# Patient Record
Sex: Female | Born: 1940 | Race: White | Hispanic: No | Marital: Single | Smoking: Former smoker
Health system: Southern US, Community
[De-identification: ages and names within clinical notes are randomized; demographics above are authoritative.]

## PROBLEM LIST (undated history)

## (undated) DIAGNOSIS — R609 Edema, unspecified: Secondary | ICD-10-CM

## (undated) DIAGNOSIS — M545 Low back pain, unspecified: Secondary | ICD-10-CM

## (undated) DIAGNOSIS — C801 Malignant (primary) neoplasm, unspecified: Secondary | ICD-10-CM

## (undated) DIAGNOSIS — K7460 Unspecified cirrhosis of liver: Secondary | ICD-10-CM

## (undated) DIAGNOSIS — E7800 Pure hypercholesterolemia, unspecified: Secondary | ICD-10-CM

## (undated) DIAGNOSIS — K921 Melena: Secondary | ICD-10-CM

## (undated) DIAGNOSIS — M1990 Unspecified osteoarthritis, unspecified site: Secondary | ICD-10-CM

## (undated) DIAGNOSIS — H409 Unspecified glaucoma: Secondary | ICD-10-CM

## (undated) DIAGNOSIS — I10 Essential (primary) hypertension: Secondary | ICD-10-CM

## (undated) DIAGNOSIS — R0600 Dyspnea, unspecified: Secondary | ICD-10-CM

## (undated) DIAGNOSIS — IMO0002 Reserved for concepts with insufficient information to code with codable children: Secondary | ICD-10-CM

## (undated) DIAGNOSIS — Z9981 Dependence on supplemental oxygen: Secondary | ICD-10-CM

## (undated) DIAGNOSIS — R413 Other amnesia: Secondary | ICD-10-CM

## (undated) DIAGNOSIS — J189 Pneumonia, unspecified organism: Secondary | ICD-10-CM

## (undated) DIAGNOSIS — E119 Type 2 diabetes mellitus without complications: Secondary | ICD-10-CM

## (undated) DIAGNOSIS — I739 Peripheral vascular disease, unspecified: Secondary | ICD-10-CM

## (undated) DIAGNOSIS — R569 Unspecified convulsions: Secondary | ICD-10-CM

## (undated) DIAGNOSIS — G928 Other toxic encephalopathy: Secondary | ICD-10-CM

## (undated) DIAGNOSIS — J449 Chronic obstructive pulmonary disease, unspecified: Secondary | ICD-10-CM

## (undated) DIAGNOSIS — R001 Bradycardia, unspecified: Secondary | ICD-10-CM

## (undated) DIAGNOSIS — G92 Toxic encephalopathy: Secondary | ICD-10-CM

## (undated) DIAGNOSIS — R7301 Impaired fasting glucose: Secondary | ICD-10-CM

## (undated) DIAGNOSIS — K219 Gastro-esophageal reflux disease without esophagitis: Secondary | ICD-10-CM

## (undated) DIAGNOSIS — G44229 Chronic tension-type headache, not intractable: Secondary | ICD-10-CM

## (undated) DIAGNOSIS — G8929 Other chronic pain: Secondary | ICD-10-CM

## (undated) DIAGNOSIS — B070 Plantar wart: Secondary | ICD-10-CM

## (undated) DIAGNOSIS — D649 Anemia, unspecified: Secondary | ICD-10-CM

## (undated) DIAGNOSIS — K759 Inflammatory liver disease, unspecified: Secondary | ICD-10-CM

## (undated) DIAGNOSIS — G43009 Migraine without aura, not intractable, without status migrainosus: Secondary | ICD-10-CM

## (undated) DIAGNOSIS — J45909 Unspecified asthma, uncomplicated: Secondary | ICD-10-CM

## (undated) DIAGNOSIS — M6282 Rhabdomyolysis: Secondary | ICD-10-CM

## (undated) DIAGNOSIS — G250 Essential tremor: Secondary | ICD-10-CM

## (undated) DIAGNOSIS — F32A Depression, unspecified: Secondary | ICD-10-CM

## (undated) DIAGNOSIS — F410 Panic disorder [episodic paroxysmal anxiety] without agoraphobia: Secondary | ICD-10-CM

## (undated) DIAGNOSIS — F329 Major depressive disorder, single episode, unspecified: Secondary | ICD-10-CM

## (undated) DIAGNOSIS — I639 Cerebral infarction, unspecified: Secondary | ICD-10-CM

## (undated) HISTORY — PX: APPENDECTOMY: SHX54

## (undated) HISTORY — DX: Plantar wart: B07.0

## (undated) HISTORY — DX: Pure hypercholesterolemia, unspecified: E78.00

## (undated) HISTORY — DX: Toxic encephalopathy: G92

## (undated) HISTORY — DX: Depression, unspecified: F32.A

## (undated) HISTORY — DX: Essential tremor: G25.0

## (undated) HISTORY — DX: Migraine without aura, not intractable, without status migrainosus: G43.009

## (undated) HISTORY — DX: Other toxic encephalopathy: G92.8

## (undated) HISTORY — DX: Bradycardia, unspecified: R00.1

## (undated) HISTORY — DX: Anemia, unspecified: D64.9

## (undated) HISTORY — DX: Chronic tension-type headache, not intractable: G44.229

## (undated) HISTORY — DX: Panic disorder (episodic paroxysmal anxiety): F41.0

## (undated) HISTORY — DX: Edema, unspecified: R60.9

## (undated) HISTORY — PX: TONSILLECTOMY: SUR1361

## (undated) HISTORY — DX: Chronic obstructive pulmonary disease, unspecified: J44.9

## (undated) HISTORY — DX: Impaired fasting glucose: R73.01

## (undated) HISTORY — DX: Low back pain: M54.5

## (undated) HISTORY — DX: Unspecified osteoarthritis, unspecified site: M19.90

## (undated) HISTORY — DX: Other chronic pain: G89.29

## (undated) HISTORY — DX: Other amnesia: R41.3

## (undated) HISTORY — DX: Rhabdomyolysis: M62.82

## (undated) HISTORY — DX: Reserved for concepts with insufficient information to code with codable children: IMO0002

## (undated) HISTORY — DX: Unspecified asthma, uncomplicated: J45.909

## (undated) HISTORY — DX: Major depressive disorder, single episode, unspecified: F32.9

## (undated) HISTORY — DX: Gastro-esophageal reflux disease without esophagitis: K21.9

## (undated) HISTORY — DX: Cerebral infarction, unspecified: I63.9

## (undated) HISTORY — DX: Unspecified convulsions: R56.9

## (undated) HISTORY — DX: Low back pain, unspecified: M54.50

---
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
## 1969-08-20 HISTORY — PX: CHOLECYSTECTOMY: SHX55

## 1979-08-21 HISTORY — PX: OTHER SURGICAL HISTORY: SHX169

## 2004-12-20 HISTORY — PX: OTHER SURGICAL HISTORY: SHX169

## 2005-06-25 ENCOUNTER — Ambulatory Visit (HOSPITAL_COMMUNITY): Admission: RE | Admit: 2005-06-25 | Discharge: 2005-06-25 | Payer: Self-pay | Admitting: Neurology

## 2005-12-27 ENCOUNTER — Ambulatory Visit (HOSPITAL_COMMUNITY): Admission: RE | Admit: 2005-12-27 | Discharge: 2005-12-27 | Payer: Self-pay | Admitting: Neurology

## 2006-12-20 HISTORY — PX: OTHER SURGICAL HISTORY: SHX169

## 2008-12-20 HISTORY — PX: OTHER SURGICAL HISTORY: SHX169

## 2013-03-13 DIAGNOSIS — J309 Allergic rhinitis, unspecified: Secondary | ICD-10-CM | POA: Insufficient documentation

## 2013-03-13 DIAGNOSIS — M79606 Pain in leg, unspecified: Secondary | ICD-10-CM | POA: Insufficient documentation

## 2013-03-13 DIAGNOSIS — M549 Dorsalgia, unspecified: Secondary | ICD-10-CM | POA: Insufficient documentation

## 2013-03-13 DIAGNOSIS — F410 Panic disorder [episodic paroxysmal anxiety] without agoraphobia: Secondary | ICD-10-CM | POA: Insufficient documentation

## 2013-03-13 DIAGNOSIS — R5383 Other fatigue: Secondary | ICD-10-CM | POA: Insufficient documentation

## 2013-03-13 DIAGNOSIS — R569 Unspecified convulsions: Secondary | ICD-10-CM | POA: Insufficient documentation

## 2013-03-13 DIAGNOSIS — M810 Age-related osteoporosis without current pathological fracture: Secondary | ICD-10-CM | POA: Insufficient documentation

## 2013-03-13 DIAGNOSIS — F4310 Post-traumatic stress disorder, unspecified: Secondary | ICD-10-CM | POA: Insufficient documentation

## 2013-03-13 DIAGNOSIS — F32A Depression, unspecified: Secondary | ICD-10-CM | POA: Insufficient documentation

## 2013-03-13 DIAGNOSIS — F329 Major depressive disorder, single episode, unspecified: Secondary | ICD-10-CM | POA: Insufficient documentation

## 2013-03-13 DIAGNOSIS — D649 Anemia, unspecified: Secondary | ICD-10-CM | POA: Insufficient documentation

## 2013-03-13 DIAGNOSIS — I639 Cerebral infarction, unspecified: Secondary | ICD-10-CM | POA: Insufficient documentation

## 2013-03-13 DIAGNOSIS — Z79899 Other long term (current) drug therapy: Secondary | ICD-10-CM | POA: Insufficient documentation

## 2013-03-13 DIAGNOSIS — S12100A Unspecified displaced fracture of second cervical vertebra, initial encounter for closed fracture: Secondary | ICD-10-CM | POA: Insufficient documentation

## 2013-03-13 DIAGNOSIS — S32009A Unspecified fracture of unspecified lumbar vertebra, initial encounter for closed fracture: Secondary | ICD-10-CM | POA: Insufficient documentation

## 2013-04-13 DIAGNOSIS — Z9889 Other specified postprocedural states: Secondary | ICD-10-CM | POA: Insufficient documentation

## 2014-06-03 ENCOUNTER — Encounter: Payer: Self-pay | Admitting: Critical Care Medicine

## 2014-06-04 ENCOUNTER — Encounter: Payer: Self-pay | Admitting: Critical Care Medicine

## 2014-06-04 ENCOUNTER — Ambulatory Visit (INDEPENDENT_AMBULATORY_CARE_PROVIDER_SITE_OTHER): Payer: Self-pay | Admitting: Critical Care Medicine

## 2014-06-04 VITALS — BP 140/66 | HR 67 | Temp 98.4°F | Ht 60.0 in | Wt 144.0 lb

## 2014-06-04 DIAGNOSIS — G928 Other toxic encephalopathy: Secondary | ICD-10-CM | POA: Insufficient documentation

## 2014-06-04 DIAGNOSIS — G8929 Other chronic pain: Secondary | ICD-10-CM | POA: Insufficient documentation

## 2014-06-04 DIAGNOSIS — F17200 Nicotine dependence, unspecified, uncomplicated: Secondary | ICD-10-CM

## 2014-06-04 DIAGNOSIS — Z8673 Personal history of transient ischemic attack (TIA), and cerebral infarction without residual deficits: Secondary | ICD-10-CM | POA: Insufficient documentation

## 2014-06-04 DIAGNOSIS — D649 Anemia, unspecified: Secondary | ICD-10-CM | POA: Insufficient documentation

## 2014-06-04 DIAGNOSIS — M545 Low back pain: Secondary | ICD-10-CM

## 2014-06-04 DIAGNOSIS — K219 Gastro-esophageal reflux disease without esophagitis: Secondary | ICD-10-CM | POA: Insufficient documentation

## 2014-06-04 DIAGNOSIS — E7800 Pure hypercholesterolemia, unspecified: Secondary | ICD-10-CM | POA: Insufficient documentation

## 2014-06-04 DIAGNOSIS — R413 Other amnesia: Secondary | ICD-10-CM | POA: Insufficient documentation

## 2014-06-04 DIAGNOSIS — J449 Chronic obstructive pulmonary disease, unspecified: Secondary | ICD-10-CM

## 2014-06-04 DIAGNOSIS — G92 Toxic encephalopathy: Secondary | ICD-10-CM | POA: Insufficient documentation

## 2014-06-04 DIAGNOSIS — R569 Unspecified convulsions: Secondary | ICD-10-CM | POA: Insufficient documentation

## 2014-06-04 DIAGNOSIS — J4489 Other specified chronic obstructive pulmonary disease: Secondary | ICD-10-CM

## 2014-06-04 MED ORDER — IPRATROPIUM-ALBUTEROL 20-100 MCG/ACT IN AERS: INHALATION_SPRAY | RESPIRATORY_TRACT | Status: DC

## 2014-06-04 MED ORDER — BUDESONIDE-FORMOTEROL FUMARATE 160-4.5 MCG/ACT IN AERO: 2.0000 | INHALATION_SPRAY | Freq: Two times a day (BID) | RESPIRATORY_TRACT | Status: DC

## 2014-06-04 MED ORDER — PREDNISONE 10 MG PO TABS: ORAL_TABLET | ORAL | Status: DC

## 2014-06-04 MED ORDER — CEFUROXIME AXETIL 500 MG PO TABS: 500.0000 mg | ORAL_TABLET | Freq: Two times a day (BID) | ORAL | Status: DC

## 2014-06-04 NOTE — Patient Instructions (Addendum)
Take Cefuroxime 500mg  twice daily Prednisone 10mg  Take 4 for three days 3 for three days 2 for three days 1 for three days and stop Symbicort two puff twice daily Use combivent as needed Stop smoking , use nicorette minis 4mg  4-6 per day Return 6 weeks

## 2014-06-04 NOTE — Progress Notes (Addendum)
Subjective:    Patient ID: Lindsay Juarez, female    DOB: 07-22-1941, 73 y.o.   MRN: [id number] Chief Complaint  Patient presents with  . Pulmonary Consult    referred by Dr. Rochel Brome - has SOB occas at rest but mostly with exertion.  Occas cough.  Has recent sycopal episode - was admitted to [facility].      HPI Comments: Referral for Copd . Recent hospital stay.  fx foot, while in [place]. Early 90s.  Dx copd 20yrs ago. Still smoking since age 48.  Pt fell end of May and in hospital.  Started smoking again.   Shortness of Breath This is a new problem. The current episode started more than 1 month ago. The problem occurs daily (exertional only.  notes dyspnea at night). The problem has been unchanged. Associated symptoms include chest pain, sputum production and wheezing. Pertinent negatives include no abdominal pain, hemoptysis or sore throat. The symptoms are aggravated by smoke, fumes and odors. Associated symptoms comments: Mucus is light green to dark. Risk factors include smoking. She has tried beta agonist inhalers for the symptoms. The treatment provided moderate relief. Her past medical history is significant for asthma and COPD. There is no history of CAD, DVT, a heart failure, PE or pneumonia.    Past Medical History  Diagnosis Date  . Asthma   . Bradycardia   . Chronic low back pain   . Chronic tension headache   . Common migraine   . COPD (chronic obstructive pulmonary disease)   . Degenerative disc disease   . Depression   . Edema   . Familial tremor   . GERD (gastroesophageal reflux disease)   . Hypercholesteremia   . Impaired fasting glucose   . Memory loss   . Osteoarthritis   . Panic attack   . Plantar wart   . Rhabdomyolysis   . Seizures   . Toxic metabolic encephalopathy   . Anemia, unspecified   . Stroke      Family History  Problem Relation Age of Onset  . Asthma Mother   . Asthma Grandchild   . COPD Mother   . Hyperlipidemia  Mother   . Hyperlipidemia Sister   . Hypertension Father   . Hypertension Maternal Aunt   . Heart attack Mother   . Congestive Heart Failure Father   . Lung cancer Mother   . Melanoma      grandmother  . Migraines Mother   . Migraines Grandchild   . Osteoarthritis Mother   . Osteoarthritis Daughter   . Depression Mother   . Depression Daughter   . Depression Grandchild   . OCD Grandchild      History   Social History  . Marital Status: Single    Spouse Name: N/A    Number of Children: N/A  . Years of Education: N/A   Occupational History  . Not on file.   Social History Main Topics  . Smoking status: Current Every Day Smoker -- 0.50 packs/day    Types: Cigarettes    Start date: 12/21/1955  . Smokeless tobacco: Never Used  . Alcohol Use: No  . Drug Use: No  . Sexual Activity: Not on file   Other Topics Concern  . Not on file   Social History Narrative  . No narrative on file     Allergies  Allergen Reactions  . Cardizem [Diltiazem Hcl]     Muscle spasm   . Fentanyl  nausea  . Levaquin [Levofloxacin In D5w]     Rash, hives, itching  . Lithium Carbonate [Lithium]     Rash and itching  . Nadolol     bradycardia  . Penicillins     Rash, hives, itching  . Prozac [Fluoxetine Hcl]     "makes me mean."  . Vitamin K And Related     Vitamin k shots - takes skin pigmentation off arm  . Xanax [Alprazolam]     tiredness     Outpatient Prescriptions Prior to Visit  Medication Sig Dispense Refill  . albuterol (PROVENTIL HFA;VENTOLIN HFA) 108 (90 BASE) MCG/ACT inhaler Inhale 2 puffs into the lungs as needed for wheezing or shortness of breath.       . citalopram (CELEXA) 40 MG tablet Take 40 mg by mouth daily.      . clotrimazole-betamethasone (LOTRISONE) cream Apply 1 application topically 2 (two) times daily as needed.       Marland Kitchen esomeprazole (NEXIUM) 40 MG capsule Take 40 mg by mouth daily at 12 noon.      . gabapentin (NEURONTIN) 300 MG capsule Take 300  mg by mouth 3 (three) times daily.      Marland Kitchen ibuprofen (ADVIL,MOTRIN) 200 MG tablet Take 800 mg by mouth 2 (two) times daily.      Marland Kitchen morphine (MS CONTIN) 15 MG 12 hr tablet Take 15 mg by mouth 2 (two) times daily.      Marland Kitchen morphine (MSIR) 15 MG tablet Take 15 mg by mouth daily as needed for severe pain.      . potassium chloride SA (K-DUR,KLOR-CON) 20 MEQ tablet Take 20 mEq by mouth daily.      Marland Kitchen spironolactone (ALDACTONE) 25 MG tablet Take 25 mg by mouth daily.      . zafirlukast (ACCOLATE) 20 MG tablet Take 20 mg by mouth 2 (two) times daily before a meal.      . Ipratropium-Albuterol (COMBIVENT RESPIMAT) 20-100 MCG/ACT AERS respimat Inhale 2 puffs into the lungs 2 (two) times daily.      . primidone (MYSOLINE) 50 MG tablet ON HOLD      . promethazine (PHENERGAN) 25 MG tablet Take 25 mg by mouth every 6 (six) hours as needed for nausea or vomiting.       No facility-administered medications prior to visit.      Review of Systems  HENT: Positive for postnasal drip. Negative for sinus pressure, sore throat, trouble swallowing and voice change.   Respiratory: Positive for cough, sputum production, shortness of breath and wheezing. Negative for hemoptysis and choking.   Cardiovascular: Positive for chest pain.  Gastrointestinal: Negative for abdominal pain.       Objective:   Physical Exam Filed Vitals:   06/04/14 1647  BP: 140/66  Pulse: 67  Temp: 98.4 F (36.9 C)  TempSrc: Oral  Height: 5' (1.524 m)  Weight: 144 lb (65.318 kg)  SpO2: 96%    Gen: Pleasant, well-nourished, in no distress,  normal affect  ENT: No lesions,  mouth clear,  oropharynx clear, no postnasal drip  Neck: No JVD, no TMG, no carotid bruits  Lungs: No use of accessory muscles, no dullness to percussion, clear without rales or rhonchi  Cardiovascular: RRR, heart sounds normal, no murmur or gallops, no peripheral edema  Abdomen: soft and NT, no HSM,  BS normal  Musculoskeletal: No deformities, no cyanosis  or clubbing  Neuro: alert, non focal  Skin: Warm, no lesions or rashes  No results found.  CXR reviewed: copd changes Ct angio chest 05/18/14: no PE, low lung volumes, retained secretions, Ground glass inflammation both upper and lower lobes     Assessment & Plan:   COPD (chronic obstructive pulmonary disease) Copd with ongoing tobacco use. Prob stage C Recurrent exacerbations Plan Take Cefuroxime 500mg  twice daily Prednisone 10mg  Take 4 for three days 3 for three days 2 for three days 1 for three days and stop Symbicort two puff twice daily Use combivent as needed Stop smoking , use nicorette minis 4mg  4-6 per day Return 6 weeks   Tobacco use disorder >75min smoking cessation counseling given   Updated Medication List Outpatient Encounter Prescriptions as of 06/04/2014  Medication Sig  . albuterol (PROVENTIL HFA;VENTOLIN HFA) 108 (90 BASE) MCG/ACT inhaler Inhale 2 puffs into the lungs as needed for wheezing or shortness of breath.   Marland Kitchen aspirin 81 MG tablet Take 81 mg by mouth daily.  . citalopram (CELEXA) 40 MG tablet Take 40 mg by mouth daily.  . clotrimazole-betamethasone (LOTRISONE) cream Apply 1 application topically 2 (two) times daily as needed.   Marland Kitchen esomeprazole (NEXIUM) 40 MG capsule Take 40 mg by mouth daily at 12 noon.  . gabapentin (NEURONTIN) 300 MG capsule Take 300 mg by mouth 3 (three) times daily.  Marland Kitchen ibuprofen (ADVIL,MOTRIN) 200 MG tablet Take 800 mg by mouth 2 (two) times daily.  . Ipratropium-Albuterol (COMBIVENT RESPIMAT) 20-100 MCG/ACT AERS respimat 1-2 puff every 6 hours as needed  . morphine (MS CONTIN) 15 MG 12 hr tablet Take 15 mg by mouth 2 (two) times daily.  Marland Kitchen morphine (MSIR) 15 MG tablet Take 15 mg by mouth daily as needed for severe pain.  . potassium chloride SA (K-DUR,KLOR-CON) 20 MEQ tablet Take 20 mEq by mouth daily.  Marland Kitchen spironolactone (ALDACTONE) 25 MG tablet Take 25 mg by mouth daily.  . zafirlukast (ACCOLATE) 20 MG tablet Take 20 mg by mouth  2 (two) times daily before a meal.  . [DISCONTINUED] Ipratropium-Albuterol (COMBIVENT RESPIMAT) 20-100 MCG/ACT AERS respimat Inhale 2 puffs into the lungs 2 (two) times daily.  . budesonide-formoterol (SYMBICORT) 160-4.5 MCG/ACT inhaler Inhale 2 puffs into the lungs 2 (two) times daily.  . budesonide-formoterol (SYMBICORT) 160-4.5 MCG/ACT inhaler Inhale 2 puffs into the lungs 2 (two) times daily.  . cefUROXime (CEFTIN) 500 MG tablet Take 1 tablet (500 mg total) by mouth 2 (two) times daily.  . predniSONE (DELTASONE) 10 MG tablet Take 4 for two days three for two days two for two days one for two days  . primidone (MYSOLINE) 50 MG tablet ON HOLD  . [DISCONTINUED] promethazine (PHENERGAN) 25 MG tablet Take 25 mg by mouth every 6 (six) hours as needed for nausea or vomiting.

## 2014-06-05 DIAGNOSIS — F17200 Nicotine dependence, unspecified, uncomplicated: Secondary | ICD-10-CM | POA: Insufficient documentation

## 2014-06-05 NOTE — Assessment & Plan Note (Signed)
Copd with ongoing tobacco use. Prob stage C Recurrent exacerbations Plan Take Cefuroxime 500mg  twice daily Prednisone 10mg  Take 4 for three days 3 for three days 2 for three days 1 for three days and stop Symbicort two puff twice daily Use combivent as needed Stop smoking , use nicorette minis 4mg  4-6 per day Return 6 weeks

## 2014-06-05 NOTE — Assessment & Plan Note (Signed)
>  68min smoking cessation counseling given

## 2014-07-16 ENCOUNTER — Ambulatory Visit (INDEPENDENT_AMBULATORY_CARE_PROVIDER_SITE_OTHER): Payer: Self-pay | Admitting: Critical Care Medicine

## 2014-07-16 ENCOUNTER — Encounter: Payer: Self-pay | Admitting: Critical Care Medicine

## 2014-07-16 VITALS — BP 114/60 | HR 65 | Temp 99.3°F | Ht 60.0 in | Wt 161.4 lb

## 2014-07-16 DIAGNOSIS — J438 Other emphysema: Secondary | ICD-10-CM

## 2014-07-16 DIAGNOSIS — J432 Centrilobular emphysema: Secondary | ICD-10-CM

## 2014-07-16 NOTE — Patient Instructions (Signed)
No change in medications Return 3 months 

## 2014-07-16 NOTE — Progress Notes (Signed)
Subjective:    Patient ID: Lindsay Juarez, female    DOB: 1941/07/02, 73 y.o.   MRN: [id number]  HPI 07/16/2014 Chief Complaint  Patient presents with  . 6 wk follow up    DOE and cough have improved.  No chest tightness/pain.   Smoking less.  Cough and dyspnea is better.  Smokes only 2 every few days. Uses the lozenges.  On symbicort twice daily two puffs.  Is on accolate and pt feels helps the pt. Pt denies any significant sore throat, nasal congestion or excess secretions, fever, chills, sweats, unintended weight loss, pleurtic or exertional chest pain, orthopnea PND, or leg swelling Pt denies any increase in rescue therapy over baseline, denies waking up needing it or having any early am or nocturnal exacerbations of coughing/wheezing/or dyspnea. Pt also denies any obvious fluctuation in symptoms with  weather or environmental change or other alleviating or aggravating factors     Review of Systems Constitutional:   No  weight loss, night sweats,  Fevers, chills, fatigue, lassitude. HEENT:   No headaches,  Difficulty swallowing,  Tooth/dental problems,  Sore throat,                No sneezing, itching, ear ache, nasal congestion, post nasal drip,   CV:  Notes chest pain,  Orthopnea, PND, swelling in lower extremities, anasarca, dizziness, palpitations  GI  No heartburn, indigestion, abdominal pain, nausea, vomiting, diarrhea, change in bowel habits, loss of appetite  Resp: Notes shortness of breath with exertion not  at rest.  No excess mucus, no productive cough,  No non-productive cough,  No coughing up of blood.  No change in color of mucus.  No wheezing.  No chest wall deformity  Skin: no rash or lesions.  GU: no dysuria, change in color of urine, no urgency or frequency.  No flank pain.  MS:  No joint pain or swelling.  No decreased range of motion.  No back pain.  Psych:  No change in mood or affect. No depression or anxiety.  No memory loss.  Notes memory loss.     Objective:   Physical Exam  Filed Vitals:   07/16/14 0955  BP: 114/60  Pulse: 65  Temp: 99.3 F (37.4 C)  TempSrc: Oral  Height: 5' (1.524 m)  Weight: 161 lb 6.4 oz (73.211 kg)  SpO2: 97%    Gen: Pleasant, well-nourished, in no distress,  normal affect  ENT: No lesions,  mouth clear,  oropharynx clear, no postnasal drip  Neck: No JVD, no TMG, no carotid bruits  Lungs: No use of accessory muscles, no dullness to percussion, distant BS  Cardiovascular: RRR, heart sounds normal, no murmur or gallops, no peripheral edema  Abdomen: soft and NT, no HSM,  BS normal  Musculoskeletal: No deformities, no cyanosis or clubbing  Neuro: alert, non focal  Skin: Warm, no lesions or rashes  No results found.       Assessment & Plan:   COPD (chronic obstructive pulmonary disease) Gold B Copd stable at present Plan Cont inhaled meds as prescribed   Updated Medication List Outpatient Encounter Prescriptions as of 07/16/2014  Medication Sig  . albuterol (PROVENTIL HFA;VENTOLIN HFA) 108 (90 BASE) MCG/ACT inhaler Inhale 2 puffs into the lungs as needed for wheezing or shortness of breath.   Marland Kitchen aspirin 81 MG tablet Take 81 mg by mouth daily.  . budesonide-formoterol (SYMBICORT) 160-4.5 MCG/ACT inhaler Inhale 2 puffs into the lungs 2 (two) times daily.  Marland Kitchen esomeprazole ([place])  40 MG capsule Take 40 mg by mouth daily at 12 noon.  . Ipratropium-Albuterol (COMBIVENT RESPIMAT) 20-100 MCG/ACT AERS respimat 1-2 puff every 6 hours as needed  . latanoprost (XALATAN) 0.005 % ophthalmic solution Place into both eyes at bedtime.  Marland Kitchen oxyCODONE-acetaminophen (PERCOCET) 10-325 MG per tablet Take 1 tablet by mouth 3 (three) times daily.  Marland Kitchen spironolactone (ALDACTONE) 25 MG tablet Take 25 mg by mouth daily.  . zafirlukast (ACCOLATE) 20 MG tablet Take 20 mg by mouth 2 (two) times daily before a meal.  . [DISCONTINUED] budesonide-formoterol (SYMBICORT) 160-4.5 MCG/ACT inhaler Inhale 2 puffs into the  lungs 2 (two) times daily.  . [DISCONTINUED] cefUROXime (CEFTIN) 500 MG tablet Take 1 tablet (500 mg total) by mouth 2 (two) times daily.  . [DISCONTINUED] citalopram (CELEXA) 40 MG tablet Take 40 mg by mouth daily.  . [DISCONTINUED] clotrimazole-betamethasone (LOTRISONE) cream Apply 1 application topically 2 (two) times daily as needed.   . [DISCONTINUED] gabapentin (NEURONTIN) 300 MG capsule Take 300 mg by mouth 3 (three) times daily.  . [DISCONTINUED] ibuprofen (ADVIL,MOTRIN) 200 MG tablet Take 800 mg by mouth 2 (two) times daily.  . [DISCONTINUED] morphine (MS CONTIN) 15 MG 12 hr tablet Take 15 mg by mouth 2 (two) times daily.  . [DISCONTINUED] morphine (MSIR) 15 MG tablet Take 15 mg by mouth daily as needed for severe pain.  . [DISCONTINUED] potassium chloride SA (K-DUR,KLOR-CON) 20 MEQ tablet Take 20 mEq by mouth daily.  . [DISCONTINUED] predniSONE (DELTASONE) 10 MG tablet Take 4 for two days three for two days two for two days one for two days  . [DISCONTINUED] primidone (MYSOLINE) 50 MG tablet ON HOLD

## 2014-07-16 NOTE — Assessment & Plan Note (Signed)
Gold B Copd stable at present Plan Cont inhaled meds as prescribed

## 2014-11-05 ENCOUNTER — Ambulatory Visit (INDEPENDENT_AMBULATORY_CARE_PROVIDER_SITE_OTHER): Payer: Self-pay | Admitting: Critical Care Medicine

## 2014-11-05 ENCOUNTER — Encounter: Payer: Self-pay | Admitting: Critical Care Medicine

## 2014-11-05 VITALS — BP 136/60 | HR 70 | Temp 98.0°F | Ht 60.0 in | Wt 167.0 lb

## 2014-11-05 DIAGNOSIS — J441 Chronic obstructive pulmonary disease with (acute) exacerbation: Secondary | ICD-10-CM

## 2014-11-05 DIAGNOSIS — J432 Centrilobular emphysema: Secondary | ICD-10-CM

## 2014-11-05 MED ORDER — MONTELUKAST SODIUM 10 MG PO TABS: 10.0000 mg | ORAL_TABLET | Freq: Every day | ORAL | Status: AC

## 2014-11-05 NOTE — Progress Notes (Signed)
Subjective:    Patient ID: Lindsay Juarez, female    DOB: 1941/08/08, 73 y.o.   MRN: [id number]  HPI  11/05/2014 Chief Complaint  Patient presents with  . 3 month follow up    runny nose, PND, HA, and cough with yellow mucus x 1 wk.  DOE at baseline.  Not much change. Difficulty getting accolate. On singulair and may help ok.  Notes some pndrip and worse for one week.  Notes some yellow mucus , to clear and [place].  Notes dyspnea is persistent.  No smoking for two weeks.  Pt denies any significant sore throat, nasal congestion or excess secretions, fever, chills, sweats, unintended weight loss, pleurtic or exertional chest pain, orthopnea PND, or leg swelling Pt denies any increase in rescue therapy over baseline, denies waking up needing it or having any early am or nocturnal exacerbations of coughing/wheezing/or dyspnea. Pt also denies any obvious fluctuation in symptoms with  weather or environmental change or other alleviating or aggravating factors   Review of Systems Constitutional:   No  weight loss, night sweats,  Fevers, chills, fatigue, lassitude. HEENT:   No headaches,  Difficulty swallowing,  Tooth/dental problems,  Sore throat,                No sneezing, itching, ear ache, nasal congestion, post nasal drip,   CV:  Notes chest pain,  Orthopnea, PND, swelling in lower extremities, anasarca, dizziness, palpitations  GI  No heartburn, indigestion, abdominal pain, nausea, vomiting, diarrhea, change in bowel habits, loss of appetite  Resp: Notes shortness of breath with exertion not  at rest.  No excess mucus, notes productive cough,  No non-productive cough,  No coughing up of blood.  No change in color of mucus.  No wheezing.  No chest wall deformity  Skin: no rash or lesions.  GU: no dysuria, change in color of urine, no urgency or frequency.  No flank pain.  MS:  No joint pain or swelling.  No decreased range of motion.  No back pain.  Psych:  No change in mood or  affect. No depression or anxiety.  Notes memory loss.     Objective:   Physical Exam  Filed Vitals:   11/05/14 0947  BP: 136/60  Pulse: 70  Temp: 98 F (36.7 C)  TempSrc: Oral  Height: 5' (1.524 m)  Weight: 167 lb (75.751 kg)  SpO2: 95%    Gen: Pleasant, well-nourished, in no distress,  normal affect  ENT: No lesions,  mouth clear,  oropharynx clear, no postnasal drip  Neck: No JVD, no TMG, no carotid bruits  Lungs: No use of accessory muscles, no dullness to percussion, distant BS  Cardiovascular: RRR, heart sounds normal, no murmur or gallops, no peripheral edema  Abdomen: soft and NT, no HSM,  BS normal  Musculoskeletal: No deformities, no cyanosis or clubbing  Neuro: alert, non focal  Skin: Warm, no lesions or rashes  No results found.       Assessment & Plan:   COPD (chronic obstructive pulmonary disease) Copd stable at present Plan Stay off combivent Stay on symbicort and as needed albuterol Ok to be on singulair Return 4 months     Updated Medication List Outpatient Encounter Prescriptions as of 11/05/2014  Medication Sig  . albuterol (PROVENTIL HFA;VENTOLIN HFA) 108 (90 BASE) MCG/ACT inhaler Inhale 2 puffs into the lungs as needed for wheezing or shortness of breath.   Marland Kitchen aspirin 81 MG tablet Take 81 mg by mouth daily.  Marland Kitchen  atorvastatin (LIPITOR) 10 MG tablet Take 1 tablet by mouth daily.  . budesonide-formoterol (SYMBICORT) 160-4.5 MCG/ACT inhaler Inhale 2 puffs into the lungs 2 (two) times daily.  . citalopram (CELEXA) 40 MG tablet Take 1 tablet by mouth daily.  Marland Kitchen esomeprazole (NEXIUM) 40 MG capsule Take 40 mg by mouth daily at 12 noon.  . latanoprost (XALATAN) 0.005 % ophthalmic solution Place into both eyes at bedtime.  Marland Kitchen levorphanol (LEVODROMORAN) 2 MG tablet Take 1 tablet by mouth every 8 (eight) hours.  Marland Kitchen oxyCODONE-acetaminophen (PERCOCET) 10-325 MG per tablet Take 1 tablet by mouth 3 (three) times daily.  . potassium chloride SA  (K-DUR,KLOR-CON) 20 MEQ tablet Take 1 tablet by mouth daily.  Marland Kitchen spironolactone (ALDACTONE) 25 MG tablet Take 25 mg by mouth daily.  . [DISCONTINUED] zafirlukast (ACCOLATE) 20 MG tablet Take 20 mg by mouth 2 (two) times daily before a meal.  . montelukast (SINGULAIR) 10 MG tablet Take 1 tablet (10 mg total) by mouth at bedtime.  . [DISCONTINUED] Ipratropium-Albuterol (COMBIVENT RESPIMAT) 20-100 MCG/ACT AERS respimat 1-2 puff every 6 hours as needed

## 2014-11-05 NOTE — Patient Instructions (Signed)
Stay off combivent Stay on symbicort and as needed albuterol Ok to be on singulair Return 4 months

## 2014-11-05 NOTE — Assessment & Plan Note (Signed)
Copd stable at present Plan Stay off combivent Stay on symbicort and as needed albuterol Ok to be on singulair Return 4 months

## 2014-11-25 ENCOUNTER — Telehealth: Payer: Self-pay | Admitting: Critical Care Medicine

## 2014-11-25 NOTE — Telephone Encounter (Signed)
Pt returning call.Lindsay Juarez ° °

## 2014-11-25 NOTE — Telephone Encounter (Signed)
Pt returned call. Informed pt of the recs per MW. Appt made with TP on 11/26/14. Pt verbalized understanding and denied any further questions or concerns at this time.

## 2014-11-25 NOTE — Telephone Encounter (Signed)
lmtcb x1 

## 2014-11-25 NOTE — Telephone Encounter (Signed)
lmtcb for pt.  

## 2014-11-25 NOTE — Telephone Encounter (Signed)
stongly prefer ov but if declines ok to rx as she's requeting but ov with Tammy NP by 11/29/14 if not improving

## 2014-11-25 NOTE — Telephone Encounter (Signed)
Pt states she is having a hard time with her breathing right now. For the past 3 days she is having a productive cough-yellow/green in color, SOB and wheezing, vomited last night, fever as well. Pt states she has attempted to contact her PCP Dr Tobie Poet and that MD is booked up and unable to see the patient.   Pt is requesting to have same abx PW gave her on 05-25-14 visit as this works well for her.   Cefuroxime 500 mg 1 po BID. Pt is aware that PW is not in the office and will need to send MW as doc of day.   Allergies  Allergen Reactions  . Cardizem [Diltiazem Hcl]     Muscle spasm   . Fentanyl     nausea  . Levaquin [Levofloxacin In D5w]     Rash, hives, itching  . Lithium Carbonate [Lithium]     Rash and itching  . Nadolol     bradycardia  . Penicillins     Rash, hives, itching  . Prozac [Fluoxetine Hcl]     "makes me mean."  . Vitamin K And Related     Vitamin k shots - takes skin pigmentation off arm  . Xanax [Alprazolam]     tiredness

## 2014-11-26 ENCOUNTER — Ambulatory Visit (INDEPENDENT_AMBULATORY_CARE_PROVIDER_SITE_OTHER): Payer: Medicare Other | Admitting: Adult Health

## 2014-11-26 ENCOUNTER — Encounter: Payer: Self-pay | Admitting: Adult Health

## 2014-11-26 ENCOUNTER — Ambulatory Visit (INDEPENDENT_AMBULATORY_CARE_PROVIDER_SITE_OTHER)
Admission: RE | Admit: 2014-11-26 | Discharge: 2014-11-26 | Disposition: A | Discharge status: Home / Self Care | Payer: Medicare Other | Source: Ambulatory Visit | Attending: Adult Health | Admitting: Adult Health

## 2014-11-26 ENCOUNTER — Ambulatory Visit: Payer: Self-pay | Admitting: Adult Health

## 2014-11-26 VITALS — BP 138/60 | HR 77 | Temp 98.1°F | Ht 59.75 in | Wt 173.6 lb

## 2014-11-26 DIAGNOSIS — J441 Chronic obstructive pulmonary disease with (acute) exacerbation: Secondary | ICD-10-CM

## 2014-11-26 DIAGNOSIS — J432 Centrilobular emphysema: Secondary | ICD-10-CM

## 2014-11-26 MED ORDER — CEFUROXIME AXETIL 500 MG PO TABS: 500.0000 mg | ORAL_TABLET | Freq: Two times a day (BID) | ORAL | Status: DC

## 2014-11-26 NOTE — Patient Instructions (Addendum)
Ceftin 500 mg one twice daily for 7 days, take with food Mucinex DM twice daily as needed for cough, congestion Fluids and rest Tylenol As needed   Follow up Dr. Joya Gaskins  As planned and As needed   Please contact office for sooner follow up if symptoms do not improve or worsen or seek emergency care

## 2014-11-26 NOTE — Progress Notes (Signed)
   Subjective:    Patient ID: Lindsay Juarez, female    DOB: 31-Jan-1941, 73 y.o.   MRN: [id number]  HPI 73 yo female with COPD -former smoker 10/2014 .   11/26/2014 Acute OV Complains of prod cough with yellow/green mucus, increased SOB, wheezing for 3 days . Had fever up to 102 yesterday .  Patient denies any chest pain, orthopnea, PND or leg swelling Has not smoked for 1 month.    Review of Systems Constitutional:   No  weight loss, night sweats,  Fevers, chills,  +fatigue, or  lassitude.  HEENT:   No headaches,  Difficulty swallowing,  Tooth/dental problems, or  Sore throat,                No sneezing, itching, ear ache, + nasal congestion, post nasal drip,   CV:  No chest pain,  Orthopnea, PND, swelling in lower extremities, anasarca, dizziness, palpitations, syncope.   GI  No heartburn, indigestion, abdominal pain, nausea, vomiting, diarrhea, change in bowel habits, loss of appetite, bloody stools.   Resp:   No chest wall deformity  Skin: no rash or lesions.   GU: no dysuria, change in color of urine, no urgency or frequency.  No flank pain, no hematuria   MS:  No joint pain or swelling.  No decreased range of motion.  No back pain.  Psych:  No change in mood or affect. No depression or anxiety.  No memory loss.         Objective:   Physical Exam GEN: A/Ox3; pleasant , NAD  HEENT:  [place]/AT,  EACs-clear, TMs-wnl, NOSE-clear, THROAT-clear, no lesions, no postnasal drip or exudate noted.   NECK:  Supple w/ fair ROM; no JVD; normal carotid impulses w/o bruits; no thyromegaly or nodules palpated; no lymphadenopathy.  RESP  Few rhonchi , no accessory muscle use, no dullness to percussion  CARD:  RRR, no m/r/g  , no peripheral edema, pulses intact, no cyanosis or clubbing.  GI:   Soft & nt; nml bowel sounds; no organomegaly or masses detected.  Musco: Warm bil, no deformities or joint swelling noted.   Neuro: alert, no focal deficits noted.    Skin: Warm, no  lesions or rashes         Assessment & Plan:

## 2014-11-26 NOTE — Assessment & Plan Note (Signed)
Exacerbation  Check cxr   Plan  Ceftin 500 mg one twice daily for 7 days, take with food Mucinex DM twice daily as needed for cough, congestion Fluids and rest Tylenol As needed   Follow up Dr. Joya Gaskins  As planned and As needed   Please contact office for sooner follow up if symptoms do not improve or worsen or seek emergency care

## 2014-12-30 DIAGNOSIS — G894 Chronic pain syndrome: Secondary | ICD-10-CM | POA: Diagnosis not present

## 2014-12-30 DIAGNOSIS — M5416 Radiculopathy, lumbar region: Secondary | ICD-10-CM | POA: Diagnosis not present

## 2014-12-30 DIAGNOSIS — Z8781 Personal history of (healed) traumatic fracture: Secondary | ICD-10-CM | POA: Diagnosis not present

## 2014-12-30 DIAGNOSIS — M5136 Other intervertebral disc degeneration, lumbar region: Secondary | ICD-10-CM | POA: Diagnosis not present

## 2015-01-20 DIAGNOSIS — F1120 Opioid dependence, uncomplicated: Secondary | ICD-10-CM | POA: Diagnosis not present

## 2015-01-20 DIAGNOSIS — M5136 Other intervertebral disc degeneration, lumbar region: Secondary | ICD-10-CM | POA: Diagnosis not present

## 2015-01-20 DIAGNOSIS — M5416 Radiculopathy, lumbar region: Secondary | ICD-10-CM | POA: Diagnosis not present

## 2015-01-20 DIAGNOSIS — Z8781 Personal history of (healed) traumatic fracture: Secondary | ICD-10-CM | POA: Diagnosis not present

## 2015-01-20 DIAGNOSIS — G894 Chronic pain syndrome: Secondary | ICD-10-CM | POA: Diagnosis not present

## 2015-01-22 DIAGNOSIS — M5126 Other intervertebral disc displacement, lumbar region: Secondary | ICD-10-CM | POA: Diagnosis not present

## 2015-01-22 DIAGNOSIS — M47816 Spondylosis without myelopathy or radiculopathy, lumbar region: Secondary | ICD-10-CM | POA: Diagnosis not present

## 2015-01-22 DIAGNOSIS — M5136 Other intervertebral disc degeneration, lumbar region: Secondary | ICD-10-CM | POA: Diagnosis not present

## 2015-01-22 DIAGNOSIS — M4806 Spinal stenosis, lumbar region: Secondary | ICD-10-CM | POA: Diagnosis not present

## 2015-02-17 DIAGNOSIS — M5416 Radiculopathy, lumbar region: Secondary | ICD-10-CM | POA: Diagnosis not present

## 2015-02-17 DIAGNOSIS — G894 Chronic pain syndrome: Secondary | ICD-10-CM | POA: Diagnosis not present

## 2015-02-17 DIAGNOSIS — M5136 Other intervertebral disc degeneration, lumbar region: Secondary | ICD-10-CM | POA: Diagnosis not present

## 2015-02-17 DIAGNOSIS — M5090 Cervical disc disorder, unspecified, unspecified cervical region: Secondary | ICD-10-CM | POA: Diagnosis not present

## 2015-02-17 DIAGNOSIS — F1120 Opioid dependence, uncomplicated: Secondary | ICD-10-CM | POA: Diagnosis not present

## 2015-03-04 DIAGNOSIS — E780 Pure hypercholesterolemia: Secondary | ICD-10-CM | POA: Diagnosis not present

## 2015-03-04 DIAGNOSIS — M5416 Radiculopathy, lumbar region: Secondary | ICD-10-CM | POA: Diagnosis not present

## 2015-03-04 DIAGNOSIS — R7301 Impaired fasting glucose: Secondary | ICD-10-CM | POA: Diagnosis not present

## 2015-03-26 DIAGNOSIS — M549 Dorsalgia, unspecified: Secondary | ICD-10-CM | POA: Diagnosis not present

## 2015-03-26 DIAGNOSIS — G894 Chronic pain syndrome: Secondary | ICD-10-CM | POA: Diagnosis not present

## 2015-03-26 DIAGNOSIS — M4716 Other spondylosis with myelopathy, lumbar region: Secondary | ICD-10-CM | POA: Diagnosis not present

## 2015-03-26 DIAGNOSIS — M4806 Spinal stenosis, lumbar region: Secondary | ICD-10-CM | POA: Diagnosis not present

## 2015-03-26 DIAGNOSIS — M419 Scoliosis, unspecified: Secondary | ICD-10-CM | POA: Diagnosis not present

## 2015-03-31 DIAGNOSIS — F1120 Opioid dependence, uncomplicated: Secondary | ICD-10-CM | POA: Diagnosis not present

## 2015-03-31 DIAGNOSIS — M5416 Radiculopathy, lumbar region: Secondary | ICD-10-CM | POA: Diagnosis not present

## 2015-03-31 DIAGNOSIS — M5136 Other intervertebral disc degeneration, lumbar region: Secondary | ICD-10-CM | POA: Diagnosis not present

## 2015-03-31 DIAGNOSIS — M5090 Cervical disc disorder, unspecified, unspecified cervical region: Secondary | ICD-10-CM | POA: Diagnosis not present

## 2015-03-31 DIAGNOSIS — G894 Chronic pain syndrome: Secondary | ICD-10-CM | POA: Diagnosis not present

## 2015-04-07 DIAGNOSIS — I10 Essential (primary) hypertension: Secondary | ICD-10-CM | POA: Diagnosis not present

## 2015-04-07 DIAGNOSIS — F321 Major depressive disorder, single episode, moderate: Secondary | ICD-10-CM | POA: Diagnosis not present

## 2015-04-10 DIAGNOSIS — R509 Fever, unspecified: Secondary | ICD-10-CM | POA: Diagnosis not present

## 2015-04-10 DIAGNOSIS — R0602 Shortness of breath: Secondary | ICD-10-CM | POA: Diagnosis not present

## 2015-04-10 DIAGNOSIS — R05 Cough: Secondary | ICD-10-CM | POA: Diagnosis not present

## 2015-04-10 DIAGNOSIS — J440 Chronic obstructive pulmonary disease with acute lower respiratory infection: Secondary | ICD-10-CM | POA: Diagnosis not present

## 2015-04-10 DIAGNOSIS — J018 Other acute sinusitis: Secondary | ICD-10-CM | POA: Diagnosis not present

## 2015-04-10 DIAGNOSIS — J209 Acute bronchitis, unspecified: Secondary | ICD-10-CM | POA: Diagnosis not present

## 2015-04-17 DIAGNOSIS — J441 Chronic obstructive pulmonary disease with (acute) exacerbation: Secondary | ICD-10-CM | POA: Diagnosis not present

## 2015-04-17 DIAGNOSIS — M5416 Radiculopathy, lumbar region: Secondary | ICD-10-CM | POA: Diagnosis not present

## 2015-04-30 DIAGNOSIS — M4696 Unspecified inflammatory spondylopathy, lumbar region: Secondary | ICD-10-CM | POA: Diagnosis not present

## 2015-04-30 DIAGNOSIS — M5417 Radiculopathy, lumbosacral region: Secondary | ICD-10-CM | POA: Diagnosis not present

## 2015-04-30 DIAGNOSIS — Z79899 Other long term (current) drug therapy: Secondary | ICD-10-CM | POA: Diagnosis not present

## 2015-04-30 DIAGNOSIS — Z5181 Encounter for therapeutic drug level monitoring: Secondary | ICD-10-CM | POA: Diagnosis not present

## 2015-05-02 DIAGNOSIS — J441 Chronic obstructive pulmonary disease with (acute) exacerbation: Secondary | ICD-10-CM | POA: Diagnosis not present

## 2015-05-27 ENCOUNTER — Ambulatory Visit (INDEPENDENT_AMBULATORY_CARE_PROVIDER_SITE_OTHER): Payer: Self-pay | Admitting: Critical Care Medicine

## 2015-05-27 ENCOUNTER — Encounter: Payer: Self-pay | Admitting: Critical Care Medicine

## 2015-05-27 VITALS — BP 108/60 | HR 68 | Temp 98.5°F | Ht 59.0 in | Wt 165.6 lb

## 2015-05-27 DIAGNOSIS — J449 Chronic obstructive pulmonary disease, unspecified: Secondary | ICD-10-CM

## 2015-05-27 DIAGNOSIS — J441 Chronic obstructive pulmonary disease with (acute) exacerbation: Secondary | ICD-10-CM

## 2015-05-27 DIAGNOSIS — J209 Acute bronchitis, unspecified: Secondary | ICD-10-CM | POA: Diagnosis not present

## 2015-05-27 MED ORDER — LOSARTAN POTASSIUM 50 MG PO TABS: 50.0000 mg | ORAL_TABLET | Freq: Every day | ORAL | Status: DC

## 2015-05-27 MED ORDER — PREDNISONE 10 MG PO TABS: ORAL_TABLET | ORAL | Status: DC

## 2015-05-27 MED ORDER — DEXTROMETHORPHAN POLISTIREX ER 30 MG/5ML PO SUER: 60.0000 mg | ORAL | Status: DC | PRN

## 2015-05-27 MED ORDER — CEFUROXIME AXETIL 500 MG PO TABS: 500.0000 mg | ORAL_TABLET | Freq: Two times a day (BID) | ORAL | Status: DC

## 2015-05-27 NOTE — Patient Instructions (Addendum)
Stop lisinopril, causes cough Start losartan '50mg'$  daily to replace lisinopril, will not cause cough Prednisone '10mg'$  Take 4 for three days 3 for three days 2 for three days 1 for three days and stop Cefuroxime '500mg'$  twice daily for 7days Stay on symbicort USe delsym as needed for cough 10ML 3-4 times daily, over the counter Return 6 weeks

## 2015-05-27 NOTE — Assessment & Plan Note (Signed)
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

## 2015-05-27 NOTE — Progress Notes (Signed)
Subjective:    Patient ID: Lindsay Juarez, female    DOB: [birth year], 74 y.o.   MRN: [id number]  HPI 05/27/2015 Chief Complaint  Patient presents with  . Follow-up    c/o cough until can't breathe x 2 mths.,keeping up at night,very rare brings up clear,occass. wheezing,sob worse,midchest tightness, soreness across chest from coughing,no fcs,?pnd. Face turns red with coughing,neb. does not help.  Recurrent cough Hx of copd on symbicort , asthma component. Last seen 12/15 for AB flare. More difficulty since 02/2015 and assoc cough and cough to point cant breath.  Cough is minimal, occ clear.  No blood. Notes some chest pain and tightness.  Pt on neb meds and inhalers. PCP rx some prednisone :  > 1 month ago, ABX > 1 month ago.  CXR: done  Pt denies any significant sore throat, nasal congestion or excess secretions, fever, chills, sweats, unintended weight loss, pleurtic or exertional chest pain, orthopnea PND, or leg swelling Pt denies any increase in rescue therapy over baseline, denies waking up needing it or having any early am or nocturnal exacerbations of coughing/wheezing/or dyspnea. Pt also denies any obvious fluctuation in symptoms with  weather or environmental change or other alleviating or aggravating factors   Current Medications, Allergies, Complete Past Medical History, Past Surgical History, Family History, and Social History were reviewed in [facility] record per todays encounter:  05/27/2015   Review of Systems  Constitutional: Negative.   HENT: Positive for postnasal drip, rhinorrhea and sinus pressure. Negative for ear pain, sore throat, trouble swallowing and voice change.   Eyes: Negative.   Respiratory: Positive for cough, chest tightness, shortness of breath and wheezing. Negative for apnea, choking and stridor.   Cardiovascular: Negative.  Negative for chest pain, palpitations and leg swelling.  Gastrointestinal: Negative for nausea, vomiting,  abdominal pain and abdominal distention.       Belching   Genitourinary: Negative.   Musculoskeletal: Negative.  Negative for myalgias and arthralgias.  Skin: Negative.  Negative for rash.  Allergic/Immunologic: Negative.  Negative for environmental allergies and food allergies.  Neurological: Negative.  Negative for dizziness, syncope, weakness and headaches.  Hematological: Negative.  Negative for adenopathy. Does not bruise/bleed easily.  Psychiatric/Behavioral: Negative.  Negative for sleep disturbance and agitation. The patient is not nervous/anxious.        Objective:   Physical Exam Filed Vitals:   05/27/15 1144  BP: 108/60  Pulse: 68  Temp: 98.5 F (36.9 C)  TempSrc: Oral  Height: '4\' 11"'$  (1.499 m)  Weight: 165 lb 9.6 oz (75.116 kg)  SpO2: 94%    Gen: Pleasant, well-nourished, in no distress,  normal affect  ENT: No lesions,  mouth clear,  oropharynx clear, no postnasal drip  Neck: No JVD, no TMG, no carotid bruits  Lungs: No use of accessory muscles, no dullness to percussion,  Inspiratory expiratory wheezes poor airflow  Cardiovascular: RRR, heart sounds normal, no murmur or gallops, no peripheral edema  Abdomen: soft and NT, no HSM,  BS normal  Musculoskeletal: No deformities, no cyanosis or clubbing  Neuro: alert, non focal  Skin: Warm, no lesions or rashes  No results found.        Assessment & Plan:  I personally reviewed all images and lab data in the [facility] system as well as any outside material available during this office visit and agree with the  radiology impressions.   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  Diagnoses and all orders for this visit:  COPD with chronic bronchitis  Acute bronchitis, unspecified organism  Chronic obstructive pulmonary disease with acute exacerbation  Other orders -     losartan (COZAAR) 50 MG tablet; Take 1 tablet (50 mg total) by mouth daily. -     predniSONE (DELTASONE) 10 MG tablet; Take 4 for three days 3 for three days 2 for three days 1 for three days and stop -     cefUROXime (CEFTIN) 500 MG tablet; Take 1 tablet (500 mg total) by mouth 2 (two) times daily. -     dextromethorphan (DELSYM) 30 MG/5ML liquid; Take 10 mLs (60 mg total) by mouth as needed for cough.

## 2015-06-06 DIAGNOSIS — M4696 Unspecified inflammatory spondylopathy, lumbar region: Secondary | ICD-10-CM | POA: Diagnosis not present

## 2015-06-06 DIAGNOSIS — M4806 Spinal stenosis, lumbar region: Secondary | ICD-10-CM | POA: Diagnosis not present

## 2015-06-06 DIAGNOSIS — M5417 Radiculopathy, lumbosacral region: Secondary | ICD-10-CM | POA: Diagnosis not present

## 2015-06-06 DIAGNOSIS — G894 Chronic pain syndrome: Secondary | ICD-10-CM | POA: Diagnosis not present

## 2015-06-12 DIAGNOSIS — E780 Pure hypercholesterolemia: Secondary | ICD-10-CM | POA: Diagnosis not present

## 2015-06-12 DIAGNOSIS — E119 Type 2 diabetes mellitus without complications: Secondary | ICD-10-CM | POA: Diagnosis not present

## 2015-06-12 DIAGNOSIS — I10 Essential (primary) hypertension: Secondary | ICD-10-CM | POA: Diagnosis not present

## 2015-06-12 DIAGNOSIS — J441 Chronic obstructive pulmonary disease with (acute) exacerbation: Secondary | ICD-10-CM | POA: Diagnosis not present

## 2015-06-12 DIAGNOSIS — D649 Anemia, unspecified: Secondary | ICD-10-CM | POA: Diagnosis not present

## 2015-06-12 DIAGNOSIS — M5416 Radiculopathy, lumbar region: Secondary | ICD-10-CM | POA: Diagnosis not present

## 2015-06-27 DIAGNOSIS — J449 Chronic obstructive pulmonary disease, unspecified: Secondary | ICD-10-CM | POA: Diagnosis not present

## 2015-06-27 DIAGNOSIS — Z885 Allergy status to narcotic agent status: Secondary | ICD-10-CM | POA: Diagnosis not present

## 2015-06-27 DIAGNOSIS — Z888 Allergy status to other drugs, medicaments and biological substances status: Secondary | ICD-10-CM | POA: Diagnosis not present

## 2015-06-27 DIAGNOSIS — K219 Gastro-esophageal reflux disease without esophagitis: Secondary | ICD-10-CM | POA: Diagnosis not present

## 2015-06-27 DIAGNOSIS — M47897 Other spondylosis, lumbosacral region: Secondary | ICD-10-CM | POA: Diagnosis not present

## 2015-06-27 DIAGNOSIS — E785 Hyperlipidemia, unspecified: Secondary | ICD-10-CM | POA: Diagnosis not present

## 2015-06-27 DIAGNOSIS — M4696 Unspecified inflammatory spondylopathy, lumbar region: Secondary | ICD-10-CM | POA: Diagnosis not present

## 2015-06-27 DIAGNOSIS — Z880 Allergy status to penicillin: Secondary | ICD-10-CM | POA: Diagnosis not present

## 2015-06-27 DIAGNOSIS — Z90710 Acquired absence of both cervix and uterus: Secondary | ICD-10-CM | POA: Diagnosis not present

## 2015-06-27 DIAGNOSIS — Z9049 Acquired absence of other specified parts of digestive tract: Secondary | ICD-10-CM | POA: Diagnosis not present

## 2015-06-27 DIAGNOSIS — F17210 Nicotine dependence, cigarettes, uncomplicated: Secondary | ICD-10-CM | POA: Diagnosis not present

## 2015-06-27 DIAGNOSIS — F329 Major depressive disorder, single episode, unspecified: Secondary | ICD-10-CM | POA: Diagnosis not present

## 2015-06-27 DIAGNOSIS — J45909 Unspecified asthma, uncomplicated: Secondary | ICD-10-CM | POA: Diagnosis not present

## 2015-06-27 DIAGNOSIS — M1991 Primary osteoarthritis, unspecified site: Secondary | ICD-10-CM | POA: Diagnosis not present

## 2015-06-27 DIAGNOSIS — F419 Anxiety disorder, unspecified: Secondary | ICD-10-CM | POA: Diagnosis not present

## 2015-06-27 DIAGNOSIS — M545 Low back pain: Secondary | ICD-10-CM | POA: Diagnosis not present

## 2015-07-01 ENCOUNTER — Ambulatory Visit: Payer: Self-pay | Admitting: Critical Care Medicine

## 2015-07-02 DIAGNOSIS — M79661 Pain in right lower leg: Secondary | ICD-10-CM | POA: Diagnosis not present

## 2015-07-02 DIAGNOSIS — W19XXXA Unspecified fall, initial encounter: Secondary | ICD-10-CM | POA: Diagnosis not present

## 2015-07-02 DIAGNOSIS — M1711 Unilateral primary osteoarthritis, right knee: Secondary | ICD-10-CM | POA: Diagnosis not present

## 2015-07-02 DIAGNOSIS — M7989 Other specified soft tissue disorders: Secondary | ICD-10-CM | POA: Diagnosis not present

## 2015-07-02 DIAGNOSIS — M25571 Pain in right ankle and joints of right foot: Secondary | ICD-10-CM | POA: Diagnosis not present

## 2015-07-02 DIAGNOSIS — S99921A Unspecified injury of right foot, initial encounter: Secondary | ICD-10-CM | POA: Diagnosis not present

## 2015-07-02 DIAGNOSIS — S8991XA Unspecified injury of right lower leg, initial encounter: Secondary | ICD-10-CM | POA: Diagnosis not present

## 2015-07-02 DIAGNOSIS — M25561 Pain in right knee: Secondary | ICD-10-CM | POA: Diagnosis not present

## 2015-07-02 DIAGNOSIS — M179 Osteoarthritis of knee, unspecified: Secondary | ICD-10-CM | POA: Diagnosis not present

## 2015-07-02 DIAGNOSIS — M79671 Pain in right foot: Secondary | ICD-10-CM | POA: Diagnosis not present

## 2015-07-02 DIAGNOSIS — M19071 Primary osteoarthritis, right ankle and foot: Secondary | ICD-10-CM | POA: Diagnosis not present

## 2015-07-02 DIAGNOSIS — S99911A Unspecified injury of right ankle, initial encounter: Secondary | ICD-10-CM | POA: Diagnosis not present

## 2015-07-02 DIAGNOSIS — M79604 Pain in right leg: Secondary | ICD-10-CM | POA: Diagnosis not present

## 2015-07-14 DIAGNOSIS — G301 Alzheimer's disease with late onset: Secondary | ICD-10-CM | POA: Diagnosis not present

## 2015-07-14 DIAGNOSIS — M25571 Pain in right ankle and joints of right foot: Secondary | ICD-10-CM | POA: Diagnosis not present

## 2015-07-14 DIAGNOSIS — J449 Chronic obstructive pulmonary disease, unspecified: Secondary | ICD-10-CM | POA: Diagnosis not present

## 2015-07-14 DIAGNOSIS — E119 Type 2 diabetes mellitus without complications: Secondary | ICD-10-CM | POA: Diagnosis not present

## 2015-07-14 DIAGNOSIS — M1711 Unilateral primary osteoarthritis, right knee: Secondary | ICD-10-CM | POA: Diagnosis not present

## 2015-07-22 ENCOUNTER — Ambulatory Visit: Payer: Self-pay | Admitting: Critical Care Medicine

## 2015-07-24 DIAGNOSIS — Z791 Long term (current) use of non-steroidal anti-inflammatories (NSAID): Secondary | ICD-10-CM | POA: Diagnosis not present

## 2015-07-24 DIAGNOSIS — Z885 Allergy status to narcotic agent status: Secondary | ICD-10-CM | POA: Diagnosis not present

## 2015-07-24 DIAGNOSIS — F329 Major depressive disorder, single episode, unspecified: Secondary | ICD-10-CM | POA: Diagnosis not present

## 2015-07-24 DIAGNOSIS — M47897 Other spondylosis, lumbosacral region: Secondary | ICD-10-CM | POA: Diagnosis not present

## 2015-07-24 DIAGNOSIS — J45909 Unspecified asthma, uncomplicated: Secondary | ICD-10-CM | POA: Diagnosis not present

## 2015-07-24 DIAGNOSIS — Z79899 Other long term (current) drug therapy: Secondary | ICD-10-CM | POA: Diagnosis not present

## 2015-07-24 DIAGNOSIS — M47817 Spondylosis without myelopathy or radiculopathy, lumbosacral region: Secondary | ICD-10-CM | POA: Diagnosis not present

## 2015-07-24 DIAGNOSIS — Z79891 Long term (current) use of opiate analgesic: Secondary | ICD-10-CM | POA: Diagnosis not present

## 2015-07-24 DIAGNOSIS — Z7951 Long term (current) use of inhaled steroids: Secondary | ICD-10-CM | POA: Diagnosis not present

## 2015-07-24 DIAGNOSIS — Z888 Allergy status to other drugs, medicaments and biological substances status: Secondary | ICD-10-CM | POA: Diagnosis not present

## 2015-07-24 DIAGNOSIS — Z9109 Other allergy status, other than to drugs and biological substances: Secondary | ICD-10-CM | POA: Diagnosis not present

## 2015-07-24 DIAGNOSIS — F419 Anxiety disorder, unspecified: Secondary | ICD-10-CM | POA: Diagnosis not present

## 2015-07-24 DIAGNOSIS — J449 Chronic obstructive pulmonary disease, unspecified: Secondary | ICD-10-CM | POA: Diagnosis not present

## 2015-07-24 DIAGNOSIS — Z880 Allergy status to penicillin: Secondary | ICD-10-CM | POA: Diagnosis not present

## 2015-07-24 DIAGNOSIS — F17200 Nicotine dependence, unspecified, uncomplicated: Secondary | ICD-10-CM | POA: Diagnosis not present

## 2015-07-30 DIAGNOSIS — G894 Chronic pain syndrome: Secondary | ICD-10-CM | POA: Diagnosis not present

## 2015-07-30 DIAGNOSIS — M4696 Unspecified inflammatory spondylopathy, lumbar region: Secondary | ICD-10-CM | POA: Diagnosis not present

## 2015-07-30 DIAGNOSIS — M4806 Spinal stenosis, lumbar region: Secondary | ICD-10-CM | POA: Diagnosis not present

## 2015-07-30 DIAGNOSIS — M5417 Radiculopathy, lumbosacral region: Secondary | ICD-10-CM | POA: Diagnosis not present

## 2015-08-05 ENCOUNTER — Encounter: Payer: Self-pay | Admitting: Critical Care Medicine

## 2015-08-05 ENCOUNTER — Ambulatory Visit (INDEPENDENT_AMBULATORY_CARE_PROVIDER_SITE_OTHER): Payer: Self-pay | Admitting: Critical Care Medicine

## 2015-08-05 VITALS — BP 154/60 | HR 101 | Temp 99.3°F | Ht 59.0 in | Wt 167.2 lb

## 2015-08-05 DIAGNOSIS — J209 Acute bronchitis, unspecified: Secondary | ICD-10-CM | POA: Diagnosis not present

## 2015-08-05 DIAGNOSIS — J449 Chronic obstructive pulmonary disease, unspecified: Secondary | ICD-10-CM | POA: Diagnosis not present

## 2015-08-05 DIAGNOSIS — J441 Chronic obstructive pulmonary disease with (acute) exacerbation: Secondary | ICD-10-CM

## 2015-08-05 DIAGNOSIS — B1920 Unspecified viral hepatitis C without hepatic coma: Secondary | ICD-10-CM | POA: Insufficient documentation

## 2015-08-05 DIAGNOSIS — J411 Mucopurulent chronic bronchitis: Secondary | ICD-10-CM

## 2015-08-05 MED ORDER — PREDNISONE 10 MG PO TABS: ORAL_TABLET | ORAL | Status: DC

## 2015-08-05 MED ORDER — CEFUROXIME AXETIL 500 MG PO TABS: 500.0000 mg | ORAL_TABLET | Freq: Two times a day (BID) | ORAL | Status: DC

## 2015-08-05 MED ORDER — METHYLPREDNISOLONE ACETATE 80 MG/ML IJ SUSP
120.0000 mg | Freq: Once | INTRAMUSCULAR | Status: AC
  Administered 2015-08-05: 120 mg via INTRAMUSCULAR

## 2015-08-05 NOTE — Progress Notes (Signed)
Subjective:    Patient ID: Lindsay Juarez, female    DOB: 1941-09-03, 74 y.o.   MRN: [id number]  HPI 08/05/2015 Chief Complaint  Patient presents with  . 6 wk follow up    Symptoms improved with abx and prednisone.  C/o head congestion, runny nose, increased nonprod cough, chest tightness, and increased SOB at times x  1 wk.  No f/c/s.   Symptoms are better on pred/abx.  Nose is still stuffy.  Cough is dry.  Hacky type cough.  No f/c/s. Pt notes , +++nasal congestion but no  excess secretions,NO  fever, chills, sweats, unintended weight loss, pleurtic or exertional chest pain, orthopnea PND, or leg swelling Pt denies any increase in rescue therapy over baseline, denies waking up needing it or having any early am or nocturnal exacerbations of/wheezing/or dyspnea. Pt does wake up coughing and gets a headache  Pt also denies any obvious fluctuation in symptoms with  weather or environmental change or other alleviating or aggravating factors   Current Medications, Allergies, Complete Past Medical History, Past Surgical History, Family History, and Social History were reviewed in [place] record per todays encounter:  08/05/2015   Review of Systems  Constitutional: Negative.   HENT: Negative.  Negative for ear pain, postnasal drip, rhinorrhea, sinus pressure, sore throat, trouble swallowing and voice change.   Eyes: Negative.   Respiratory: Positive for cough, shortness of breath and wheezing. Negative for apnea, choking, chest tightness and stridor.   Cardiovascular: Negative.  Negative for chest pain, palpitations and leg swelling.  Gastrointestinal: Negative.  Negative for nausea, vomiting, abdominal pain and abdominal distention.  Genitourinary: Negative.   Musculoskeletal: Negative.  Negative for myalgias and arthralgias.  Skin: Negative.  Negative for rash.  Allergic/Immunologic: Negative.  Negative for environmental allergies and food allergies.    Neurological: Negative.  Negative for dizziness, syncope, weakness and headaches.  Hematological: Negative.  Negative for adenopathy. Does not bruise/bleed easily.  Psychiatric/Behavioral: Negative.  Negative for sleep disturbance and agitation. The patient is not nervous/anxious.        Objective:   Physical Exam Filed Vitals:   08/05/15 1621  BP: 154/60  Pulse: 101  Temp: 99.3 F (37.4 C)  TempSrc: Oral  Height: '4\' 11"'$  (1.499 m)  Weight: 167 lb 3.2 oz (75.841 kg)  SpO2: 96%    Gen: Pleasant, well-nourished, in no distress,  normal affect  ENT: No lesions,  mouth clear,  oropharynx clear, no postnasal drip  Neck: No JVD, no TMG, no carotid bruits  Lungs: No use of accessory muscles, no dullness to percussion, exp wheezes, poor airflow  Cardiovascular: RRR, heart sounds normal, no murmur or gallops, no peripheral edema  Abdomen: soft and NT, no HSM,  BS normal  Musculoskeletal: No deformities, no cyanosis or clubbing  Neuro: alert, non focal  Skin: Warm, no lesions or rashes  No results found.        Assessment & Plan:  I personally reviewed all images and lab data in the [facility] system as well as any outside material available during this office visit and agree with the  radiology impressions.   COPD (chronic obstructive pulmonary disease) Copd with acute exacerbation Plan Cefuroxime '500mg'$  twice daily for 7days Prednisone '10mg'$  Take 4 for four days 3 for four days 2 for four days 1 for four days Stay on symbicort  A Depomedrol '120mg'$  injection was given Return 2 months   Lindsay Juarez was seen today for 6 wk follow up.  Diagnoses and all orders for this visit:  Acute bronchitis, unspecified organism -     methylPREDNISolone acetate (DEPO-MEDROL) injection 120 mg; Inject 1.5 mLs (120 mg total) into the muscle once.  COPD with chronic bronchitis  COPD with acute exacerbation -     methylPREDNISolone acetate (DEPO-MEDROL) injection 120 mg; Inject 1.5 mLs (120  mg total) into the muscle once.  Mucopurulent chronic bronchitis  Other orders -     cefUROXime (CEFTIN) 500 MG tablet; Take 1 tablet (500 mg total) by mouth 2 (two) times daily. -     predniSONE (DELTASONE) 10 MG tablet; Take 4 for four days 3 for four days 2 for four days 1 for four days

## 2015-08-05 NOTE — Patient Instructions (Signed)
Cefuroxime '500mg'$  twice daily for 7days Prednisone '10mg'$  Take 4 for four days 3 for four days 2 for four days 1 for four days Stay on symbicort  A Depomedrol '120mg'$  injection was given Return 2 months

## 2015-08-06 NOTE — Assessment & Plan Note (Signed)
Copd with acute exacerbation Plan Cefuroxime '500mg'$  twice daily for 7days Prednisone '10mg'$  Take 4 for four days 3 for four days 2 for four days 1 for four days Stay on symbicort  A Depomedrol '120mg'$  injection was given Return 2 months

## 2015-08-20 ENCOUNTER — Other Ambulatory Visit: Payer: Self-pay | Admitting: Critical Care Medicine

## 2015-08-22 NOTE — Telephone Encounter (Signed)
Received refill request for ceftin 500 mg take 1 po bid #[id number] from [facility].  This is not listed as a maintanence med on pt's med list.  lmomtcb for pt to discuss - is she having acute symptoms?

## 2015-08-27 ENCOUNTER — Telehealth: Payer: Self-pay | Admitting: Critical Care Medicine

## 2015-08-27 NOTE — Telephone Encounter (Signed)
Lindsay Juarez aware, nothing further needed at this time.

## 2015-08-27 NOTE — Telephone Encounter (Signed)
lmomtcb for pt on #s listed as home and cell #s.  Will sign off and deny rx stating pt must call office for rx.

## 2015-08-27 NOTE — Telephone Encounter (Signed)
I do not see that anyone called  She is due for ov with PW in Oct- maybe a scheduler called?? LMTCB for Tammy

## 2015-09-01 DIAGNOSIS — J0101 Acute recurrent maxillary sinusitis: Secondary | ICD-10-CM | POA: Diagnosis not present

## 2015-09-01 DIAGNOSIS — J441 Chronic obstructive pulmonary disease with (acute) exacerbation: Secondary | ICD-10-CM | POA: Diagnosis not present

## 2015-09-01 DIAGNOSIS — J449 Chronic obstructive pulmonary disease, unspecified: Secondary | ICD-10-CM | POA: Diagnosis not present

## 2015-09-15 DIAGNOSIS — Z23 Encounter for immunization: Secondary | ICD-10-CM | POA: Diagnosis not present

## 2015-09-15 DIAGNOSIS — E119 Type 2 diabetes mellitus without complications: Secondary | ICD-10-CM | POA: Diagnosis not present

## 2015-09-15 DIAGNOSIS — R05 Cough: Secondary | ICD-10-CM | POA: Diagnosis not present

## 2015-09-15 DIAGNOSIS — I10 Essential (primary) hypertension: Secondary | ICD-10-CM | POA: Diagnosis not present

## 2015-09-15 DIAGNOSIS — E780 Pure hypercholesterolemia: Secondary | ICD-10-CM | POA: Diagnosis not present

## 2015-09-15 DIAGNOSIS — E538 Deficiency of other specified B group vitamins: Secondary | ICD-10-CM | POA: Diagnosis not present

## 2015-09-15 DIAGNOSIS — E1100 Type 2 diabetes mellitus with hyperosmolarity without nonketotic hyperglycemic-hyperosmolar coma (NKHHC): Secondary | ICD-10-CM | POA: Diagnosis not present

## 2015-09-15 DIAGNOSIS — S098XXA Other specified injuries of head, initial encounter: Secondary | ICD-10-CM | POA: Diagnosis not present

## 2015-09-15 DIAGNOSIS — G301 Alzheimer's disease with late onset: Secondary | ICD-10-CM | POA: Diagnosis not present

## 2015-09-15 DIAGNOSIS — R600 Localized edema: Secondary | ICD-10-CM | POA: Diagnosis not present

## 2015-09-26 DIAGNOSIS — M1990 Unspecified osteoarthritis, unspecified site: Secondary | ICD-10-CM | POA: Diagnosis not present

## 2015-09-26 DIAGNOSIS — Z881 Allergy status to other antibiotic agents status: Secondary | ICD-10-CM | POA: Diagnosis not present

## 2015-09-26 DIAGNOSIS — Z885 Allergy status to narcotic agent status: Secondary | ICD-10-CM | POA: Diagnosis not present

## 2015-09-26 DIAGNOSIS — I10 Essential (primary) hypertension: Secondary | ICD-10-CM | POA: Diagnosis not present

## 2015-09-26 DIAGNOSIS — J449 Chronic obstructive pulmonary disease, unspecified: Secondary | ICD-10-CM | POA: Diagnosis not present

## 2015-09-26 DIAGNOSIS — M47818 Spondylosis without myelopathy or radiculopathy, sacral and sacrococcygeal region: Secondary | ICD-10-CM | POA: Diagnosis not present

## 2015-09-26 DIAGNOSIS — F17200 Nicotine dependence, unspecified, uncomplicated: Secondary | ICD-10-CM | POA: Diagnosis not present

## 2015-09-26 DIAGNOSIS — Z888 Allergy status to other drugs, medicaments and biological substances status: Secondary | ICD-10-CM | POA: Diagnosis not present

## 2015-09-26 DIAGNOSIS — M47817 Spondylosis without myelopathy or radiculopathy, lumbosacral region: Secondary | ICD-10-CM | POA: Diagnosis not present

## 2015-09-26 DIAGNOSIS — Z880 Allergy status to penicillin: Secondary | ICD-10-CM | POA: Diagnosis not present

## 2015-10-27 DIAGNOSIS — M4806 Spinal stenosis, lumbar region: Secondary | ICD-10-CM | POA: Diagnosis not present

## 2015-10-27 DIAGNOSIS — G894 Chronic pain syndrome: Secondary | ICD-10-CM | POA: Diagnosis not present

## 2015-10-27 DIAGNOSIS — M5417 Radiculopathy, lumbosacral region: Secondary | ICD-10-CM | POA: Diagnosis not present

## 2015-12-09 DIAGNOSIS — R1319 Other dysphagia: Secondary | ICD-10-CM | POA: Diagnosis not present

## 2015-12-09 DIAGNOSIS — J441 Chronic obstructive pulmonary disease with (acute) exacerbation: Secondary | ICD-10-CM | POA: Diagnosis not present

## 2015-12-31 DIAGNOSIS — M545 Low back pain: Secondary | ICD-10-CM | POA: Diagnosis not present

## 2015-12-31 DIAGNOSIS — G894 Chronic pain syndrome: Secondary | ICD-10-CM | POA: Diagnosis not present

## 2015-12-31 DIAGNOSIS — J441 Chronic obstructive pulmonary disease with (acute) exacerbation: Secondary | ICD-10-CM | POA: Diagnosis not present

## 2015-12-31 DIAGNOSIS — M5090 Cervical disc disorder, unspecified, unspecified cervical region: Secondary | ICD-10-CM | POA: Diagnosis not present

## 2015-12-31 DIAGNOSIS — Z8673 Personal history of transient ischemic attack (TIA), and cerebral infarction without residual deficits: Secondary | ICD-10-CM | POA: Diagnosis not present

## 2015-12-31 DIAGNOSIS — M5416 Radiculopathy, lumbar region: Secondary | ICD-10-CM | POA: Diagnosis not present

## 2015-12-31 DIAGNOSIS — M170 Bilateral primary osteoarthritis of knee: Secondary | ICD-10-CM | POA: Diagnosis not present

## 2015-12-31 DIAGNOSIS — F1120 Opioid dependence, uncomplicated: Secondary | ICD-10-CM | POA: Diagnosis not present

## 2016-01-02 DIAGNOSIS — R1310 Dysphagia, unspecified: Secondary | ICD-10-CM | POA: Diagnosis not present

## 2016-01-02 DIAGNOSIS — R1319 Other dysphagia: Secondary | ICD-10-CM | POA: Diagnosis not present

## 2016-01-02 DIAGNOSIS — K224 Dyskinesia of esophagus: Secondary | ICD-10-CM | POA: Diagnosis not present

## 2016-01-02 DIAGNOSIS — K219 Gastro-esophageal reflux disease without esophagitis: Secondary | ICD-10-CM | POA: Diagnosis not present

## 2016-01-07 DIAGNOSIS — K219 Gastro-esophageal reflux disease without esophagitis: Secondary | ICD-10-CM | POA: Diagnosis not present

## 2016-01-07 DIAGNOSIS — J410 Simple chronic bronchitis: Secondary | ICD-10-CM | POA: Diagnosis not present

## 2016-01-07 DIAGNOSIS — M5416 Radiculopathy, lumbar region: Secondary | ICD-10-CM | POA: Diagnosis not present

## 2016-01-07 DIAGNOSIS — M545 Low back pain: Secondary | ICD-10-CM | POA: Diagnosis not present

## 2016-01-07 DIAGNOSIS — R1319 Other dysphagia: Secondary | ICD-10-CM | POA: Diagnosis not present

## 2016-01-12 DIAGNOSIS — M5136 Other intervertebral disc degeneration, lumbar region: Secondary | ICD-10-CM | POA: Diagnosis not present

## 2016-01-12 DIAGNOSIS — Z720 Tobacco use: Secondary | ICD-10-CM | POA: Diagnosis not present

## 2016-01-12 DIAGNOSIS — G894 Chronic pain syndrome: Secondary | ICD-10-CM | POA: Diagnosis not present

## 2016-01-12 DIAGNOSIS — M5090 Cervical disc disorder, unspecified, unspecified cervical region: Secondary | ICD-10-CM | POA: Diagnosis not present

## 2016-02-16 DIAGNOSIS — H2513 Age-related nuclear cataract, bilateral: Secondary | ICD-10-CM | POA: Diagnosis not present

## 2016-02-24 DIAGNOSIS — F1120 Opioid dependence, uncomplicated: Secondary | ICD-10-CM | POA: Diagnosis not present

## 2016-02-24 DIAGNOSIS — G894 Chronic pain syndrome: Secondary | ICD-10-CM | POA: Diagnosis not present

## 2016-02-24 DIAGNOSIS — M5136 Other intervertebral disc degeneration, lumbar region: Secondary | ICD-10-CM | POA: Diagnosis not present

## 2016-02-24 DIAGNOSIS — M5090 Cervical disc disorder, unspecified, unspecified cervical region: Secondary | ICD-10-CM | POA: Diagnosis not present

## 2016-03-01 DIAGNOSIS — L03115 Cellulitis of right lower limb: Secondary | ICD-10-CM | POA: Diagnosis not present

## 2016-03-03 DIAGNOSIS — R51 Headache: Secondary | ICD-10-CM | POA: Diagnosis not present

## 2016-03-03 DIAGNOSIS — R0602 Shortness of breath: Secondary | ICD-10-CM | POA: Diagnosis not present

## 2016-03-03 DIAGNOSIS — G43909 Migraine, unspecified, not intractable, without status migrainosus: Secondary | ICD-10-CM | POA: Diagnosis not present

## 2016-03-03 DIAGNOSIS — L03115 Cellulitis of right lower limb: Secondary | ICD-10-CM | POA: Diagnosis not present

## 2016-03-08 DIAGNOSIS — J410 Simple chronic bronchitis: Secondary | ICD-10-CM | POA: Diagnosis not present

## 2016-03-08 DIAGNOSIS — R0789 Other chest pain: Secondary | ICD-10-CM | POA: Diagnosis not present

## 2016-03-08 DIAGNOSIS — L03115 Cellulitis of right lower limb: Secondary | ICD-10-CM | POA: Diagnosis not present

## 2016-03-23 DIAGNOSIS — F1120 Opioid dependence, uncomplicated: Secondary | ICD-10-CM | POA: Diagnosis not present

## 2016-03-23 DIAGNOSIS — Z79899 Other long term (current) drug therapy: Secondary | ICD-10-CM | POA: Diagnosis not present

## 2016-03-23 DIAGNOSIS — Z720 Tobacco use: Secondary | ICD-10-CM | POA: Diagnosis not present

## 2016-03-23 DIAGNOSIS — G894 Chronic pain syndrome: Secondary | ICD-10-CM | POA: Diagnosis not present

## 2016-03-23 DIAGNOSIS — M5136 Other intervertebral disc degeneration, lumbar region: Secondary | ICD-10-CM | POA: Diagnosis not present

## 2016-03-23 DIAGNOSIS — M5090 Cervical disc disorder, unspecified, unspecified cervical region: Secondary | ICD-10-CM | POA: Diagnosis not present

## 2016-03-25 DIAGNOSIS — D500 Iron deficiency anemia secondary to blood loss (chronic): Secondary | ICD-10-CM | POA: Diagnosis not present

## 2016-03-25 DIAGNOSIS — R1310 Dysphagia, unspecified: Secondary | ICD-10-CM | POA: Diagnosis not present

## 2016-03-29 DIAGNOSIS — R1310 Dysphagia, unspecified: Secondary | ICD-10-CM | POA: Diagnosis not present

## 2016-03-29 DIAGNOSIS — D500 Iron deficiency anemia secondary to blood loss (chronic): Secondary | ICD-10-CM | POA: Diagnosis not present

## 2016-04-20 DIAGNOSIS — G894 Chronic pain syndrome: Secondary | ICD-10-CM | POA: Diagnosis not present

## 2016-04-20 DIAGNOSIS — M5136 Other intervertebral disc degeneration, lumbar region: Secondary | ICD-10-CM | POA: Diagnosis not present

## 2016-04-20 DIAGNOSIS — G2581 Restless legs syndrome: Secondary | ICD-10-CM | POA: Diagnosis not present

## 2016-04-20 DIAGNOSIS — F1120 Opioid dependence, uncomplicated: Secondary | ICD-10-CM | POA: Diagnosis not present

## 2016-04-20 DIAGNOSIS — M5090 Cervical disc disorder, unspecified, unspecified cervical region: Secondary | ICD-10-CM | POA: Diagnosis not present

## 2016-04-23 DIAGNOSIS — M545 Low back pain: Secondary | ICD-10-CM | POA: Diagnosis not present

## 2016-04-23 DIAGNOSIS — J410 Simple chronic bronchitis: Secondary | ICD-10-CM | POA: Diagnosis not present

## 2016-05-12 DIAGNOSIS — I639 Cerebral infarction, unspecified: Secondary | ICD-10-CM | POA: Diagnosis not present

## 2016-05-12 DIAGNOSIS — R51 Headache: Secondary | ICD-10-CM | POA: Diagnosis not present

## 2016-05-12 DIAGNOSIS — R569 Unspecified convulsions: Secondary | ICD-10-CM | POA: Diagnosis not present

## 2016-05-14 DIAGNOSIS — D509 Iron deficiency anemia, unspecified: Secondary | ICD-10-CM | POA: Diagnosis not present

## 2016-05-18 DIAGNOSIS — R1310 Dysphagia, unspecified: Secondary | ICD-10-CM | POA: Diagnosis not present

## 2016-05-18 DIAGNOSIS — D509 Iron deficiency anemia, unspecified: Secondary | ICD-10-CM | POA: Diagnosis not present

## 2016-05-24 DIAGNOSIS — J449 Chronic obstructive pulmonary disease, unspecified: Secondary | ICD-10-CM | POA: Diagnosis not present

## 2016-05-24 DIAGNOSIS — R05 Cough: Secondary | ICD-10-CM | POA: Diagnosis not present

## 2016-05-24 DIAGNOSIS — J309 Allergic rhinitis, unspecified: Secondary | ICD-10-CM | POA: Diagnosis not present

## 2016-05-24 DIAGNOSIS — G4733 Obstructive sleep apnea (adult) (pediatric): Secondary | ICD-10-CM | POA: Diagnosis not present

## 2016-05-24 DIAGNOSIS — F17210 Nicotine dependence, cigarettes, uncomplicated: Secondary | ICD-10-CM | POA: Diagnosis not present

## 2016-05-26 DIAGNOSIS — R569 Unspecified convulsions: Secondary | ICD-10-CM | POA: Diagnosis not present

## 2016-06-09 DIAGNOSIS — R569 Unspecified convulsions: Secondary | ICD-10-CM | POA: Diagnosis not present

## 2016-06-09 DIAGNOSIS — I639 Cerebral infarction, unspecified: Secondary | ICD-10-CM | POA: Diagnosis not present

## 2016-06-09 DIAGNOSIS — R51 Headache: Secondary | ICD-10-CM | POA: Diagnosis not present

## 2016-06-24 DIAGNOSIS — E119 Type 2 diabetes mellitus without complications: Secondary | ICD-10-CM | POA: Diagnosis not present

## 2016-06-24 DIAGNOSIS — E1142 Type 2 diabetes mellitus with diabetic polyneuropathy: Secondary | ICD-10-CM | POA: Diagnosis not present

## 2016-06-24 DIAGNOSIS — E782 Mixed hyperlipidemia: Secondary | ICD-10-CM | POA: Diagnosis not present

## 2016-06-24 DIAGNOSIS — G301 Alzheimer's disease with late onset: Secondary | ICD-10-CM | POA: Diagnosis not present

## 2016-06-24 DIAGNOSIS — H10022 Other mucopurulent conjunctivitis, left eye: Secondary | ICD-10-CM | POA: Diagnosis not present

## 2016-06-24 DIAGNOSIS — I10 Essential (primary) hypertension: Secondary | ICD-10-CM | POA: Diagnosis not present

## 2016-06-25 DIAGNOSIS — Z7984 Long term (current) use of oral hypoglycemic drugs: Secondary | ICD-10-CM | POA: Diagnosis not present

## 2016-06-25 DIAGNOSIS — E7800 Pure hypercholesterolemia, unspecified: Secondary | ICD-10-CM | POA: Diagnosis not present

## 2016-06-25 DIAGNOSIS — R52 Pain, unspecified: Secondary | ICD-10-CM | POA: Diagnosis not present

## 2016-06-25 DIAGNOSIS — Z885 Allergy status to narcotic agent status: Secondary | ICD-10-CM | POA: Diagnosis not present

## 2016-06-25 DIAGNOSIS — S41109A Unspecified open wound of unspecified upper arm, initial encounter: Secondary | ICD-10-CM | POA: Diagnosis not present

## 2016-06-25 DIAGNOSIS — E11618 Type 2 diabetes mellitus with other diabetic arthropathy: Secondary | ICD-10-CM | POA: Diagnosis not present

## 2016-06-25 DIAGNOSIS — Z79891 Long term (current) use of opiate analgesic: Secondary | ICD-10-CM | POA: Diagnosis not present

## 2016-06-25 DIAGNOSIS — J449 Chronic obstructive pulmonary disease, unspecified: Secondary | ICD-10-CM | POA: Diagnosis not present

## 2016-06-25 DIAGNOSIS — Z79899 Other long term (current) drug therapy: Secondary | ICD-10-CM | POA: Diagnosis not present

## 2016-06-25 DIAGNOSIS — R296 Repeated falls: Secondary | ICD-10-CM | POA: Diagnosis not present

## 2016-06-25 DIAGNOSIS — R0781 Pleurodynia: Secondary | ICD-10-CM | POA: Diagnosis not present

## 2016-06-25 DIAGNOSIS — W010XXA Fall on same level from slipping, tripping and stumbling without subsequent striking against object, initial encounter: Secondary | ICD-10-CM | POA: Diagnosis not present

## 2016-06-25 DIAGNOSIS — R51 Headache: Secondary | ICD-10-CM | POA: Diagnosis not present

## 2016-06-25 DIAGNOSIS — I10 Essential (primary) hypertension: Secondary | ICD-10-CM | POA: Diagnosis not present

## 2016-06-25 DIAGNOSIS — J438 Other emphysema: Secondary | ICD-10-CM | POA: Diagnosis not present

## 2016-06-25 DIAGNOSIS — Z7951 Long term (current) use of inhaled steroids: Secondary | ICD-10-CM | POA: Diagnosis not present

## 2016-06-25 DIAGNOSIS — R911 Solitary pulmonary nodule: Secondary | ICD-10-CM | POA: Diagnosis not present

## 2016-06-25 DIAGNOSIS — Z886 Allergy status to analgesic agent status: Secondary | ICD-10-CM | POA: Diagnosis not present

## 2016-06-25 DIAGNOSIS — Z888 Allergy status to other drugs, medicaments and biological substances status: Secondary | ICD-10-CM | POA: Diagnosis not present

## 2016-06-25 DIAGNOSIS — M549 Dorsalgia, unspecified: Secondary | ICD-10-CM | POA: Diagnosis not present

## 2016-06-25 DIAGNOSIS — S60511A Abrasion of right hand, initial encounter: Secondary | ICD-10-CM | POA: Diagnosis not present

## 2016-06-25 DIAGNOSIS — K219 Gastro-esophageal reflux disease without esophagitis: Secondary | ICD-10-CM | POA: Diagnosis not present

## 2016-06-25 DIAGNOSIS — Z880 Allergy status to penicillin: Secondary | ICD-10-CM | POA: Diagnosis not present

## 2016-06-25 DIAGNOSIS — S50311A Abrasion of right elbow, initial encounter: Secondary | ICD-10-CM | POA: Diagnosis not present

## 2016-06-25 DIAGNOSIS — S2241XA Multiple fractures of ribs, right side, initial encounter for closed fracture: Secondary | ICD-10-CM | POA: Diagnosis not present

## 2016-06-25 DIAGNOSIS — F419 Anxiety disorder, unspecified: Secondary | ICD-10-CM | POA: Diagnosis not present

## 2016-06-25 DIAGNOSIS — S80211A Abrasion, right knee, initial encounter: Secondary | ICD-10-CM | POA: Diagnosis not present

## 2016-06-25 DIAGNOSIS — M25569 Pain in unspecified knee: Secondary | ICD-10-CM | POA: Diagnosis not present

## 2016-06-25 DIAGNOSIS — K7460 Unspecified cirrhosis of liver: Secondary | ICD-10-CM | POA: Diagnosis not present

## 2016-06-25 DIAGNOSIS — Z7982 Long term (current) use of aspirin: Secondary | ICD-10-CM | POA: Diagnosis not present

## 2016-06-25 DIAGNOSIS — Z881 Allergy status to other antibiotic agents status: Secondary | ICD-10-CM | POA: Diagnosis not present

## 2016-06-25 DIAGNOSIS — S0990XA Unspecified injury of head, initial encounter: Secondary | ICD-10-CM | POA: Diagnosis not present

## 2016-06-25 DIAGNOSIS — T07 Unspecified multiple injuries: Secondary | ICD-10-CM | POA: Diagnosis not present

## 2016-06-25 DIAGNOSIS — F17200 Nicotine dependence, unspecified, uncomplicated: Secondary | ICD-10-CM | POA: Diagnosis not present

## 2016-06-26 DIAGNOSIS — Z8673 Personal history of transient ischemic attack (TIA), and cerebral infarction without residual deficits: Secondary | ICD-10-CM | POA: Diagnosis not present

## 2016-06-26 DIAGNOSIS — J984 Other disorders of lung: Secondary | ICD-10-CM | POA: Diagnosis not present

## 2016-06-26 DIAGNOSIS — Z8711 Personal history of peptic ulcer disease: Secondary | ICD-10-CM | POA: Diagnosis not present

## 2016-06-26 DIAGNOSIS — S8002XA Contusion of left knee, initial encounter: Secondary | ICD-10-CM | POA: Diagnosis not present

## 2016-06-26 DIAGNOSIS — R0602 Shortness of breath: Secondary | ICD-10-CM | POA: Diagnosis not present

## 2016-06-26 DIAGNOSIS — M546 Pain in thoracic spine: Secondary | ICD-10-CM | POA: Diagnosis not present

## 2016-06-26 DIAGNOSIS — Z7982 Long term (current) use of aspirin: Secondary | ICD-10-CM | POA: Diagnosis not present

## 2016-06-26 DIAGNOSIS — S2241XA Multiple fractures of ribs, right side, initial encounter for closed fracture: Secondary | ICD-10-CM | POA: Diagnosis not present

## 2016-06-26 DIAGNOSIS — J449 Chronic obstructive pulmonary disease, unspecified: Secondary | ICD-10-CM | POA: Diagnosis not present

## 2016-06-26 DIAGNOSIS — Z87891 Personal history of nicotine dependence: Secondary | ICD-10-CM | POA: Diagnosis not present

## 2016-06-26 DIAGNOSIS — K219 Gastro-esophageal reflux disease without esophagitis: Secondary | ICD-10-CM | POA: Diagnosis not present

## 2016-06-26 DIAGNOSIS — S60811A Abrasion of right wrist, initial encounter: Secondary | ICD-10-CM | POA: Diagnosis not present

## 2016-06-26 DIAGNOSIS — M25562 Pain in left knee: Secondary | ICD-10-CM | POA: Diagnosis not present

## 2016-06-26 DIAGNOSIS — G8911 Acute pain due to trauma: Secondary | ICD-10-CM | POA: Diagnosis not present

## 2016-06-26 DIAGNOSIS — R0781 Pleurodynia: Secondary | ICD-10-CM | POA: Diagnosis not present

## 2016-06-26 DIAGNOSIS — E119 Type 2 diabetes mellitus without complications: Secondary | ICD-10-CM | POA: Diagnosis not present

## 2016-06-26 DIAGNOSIS — S5001XA Contusion of right elbow, initial encounter: Secondary | ICD-10-CM | POA: Diagnosis not present

## 2016-06-26 DIAGNOSIS — S9001XA Contusion of right ankle, initial encounter: Secondary | ICD-10-CM | POA: Diagnosis not present

## 2016-06-26 DIAGNOSIS — S5002XA Contusion of left elbow, initial encounter: Secondary | ICD-10-CM | POA: Diagnosis not present

## 2016-06-26 DIAGNOSIS — R0789 Other chest pain: Secondary | ICD-10-CM | POA: Diagnosis not present

## 2016-06-26 DIAGNOSIS — M25561 Pain in right knee: Secondary | ICD-10-CM | POA: Diagnosis not present

## 2016-06-26 DIAGNOSIS — M542 Cervicalgia: Secondary | ICD-10-CM | POA: Diagnosis not present

## 2016-06-26 DIAGNOSIS — S50811A Abrasion of right forearm, initial encounter: Secondary | ICD-10-CM | POA: Diagnosis not present

## 2016-06-26 DIAGNOSIS — S8001XA Contusion of right knee, initial encounter: Secondary | ICD-10-CM | POA: Diagnosis not present

## 2016-06-26 DIAGNOSIS — M79641 Pain in right hand: Secondary | ICD-10-CM | POA: Diagnosis not present

## 2016-06-26 DIAGNOSIS — R079 Chest pain, unspecified: Secondary | ICD-10-CM | POA: Diagnosis not present

## 2016-06-26 DIAGNOSIS — M545 Low back pain: Secondary | ICD-10-CM | POA: Diagnosis not present

## 2016-06-26 DIAGNOSIS — I10 Essential (primary) hypertension: Secondary | ICD-10-CM | POA: Diagnosis not present

## 2016-06-26 DIAGNOSIS — M810 Age-related osteoporosis without current pathological fracture: Secondary | ICD-10-CM | POA: Diagnosis not present

## 2016-06-26 DIAGNOSIS — K7460 Unspecified cirrhosis of liver: Secondary | ICD-10-CM | POA: Diagnosis not present

## 2016-06-26 DIAGNOSIS — M25571 Pain in right ankle and joints of right foot: Secondary | ICD-10-CM | POA: Diagnosis not present

## 2016-06-26 DIAGNOSIS — Z66 Do not resuscitate: Secondary | ICD-10-CM | POA: Diagnosis not present

## 2016-06-26 DIAGNOSIS — R911 Solitary pulmonary nodule: Secondary | ICD-10-CM | POA: Diagnosis not present

## 2016-07-06 DIAGNOSIS — S2249XA Multiple fractures of ribs, unspecified side, initial encounter for closed fracture: Secondary | ICD-10-CM | POA: Diagnosis not present

## 2016-07-06 DIAGNOSIS — J849 Interstitial pulmonary disease, unspecified: Secondary | ICD-10-CM | POA: Diagnosis not present

## 2016-07-06 DIAGNOSIS — R918 Other nonspecific abnormal finding of lung field: Secondary | ICD-10-CM | POA: Diagnosis not present

## 2016-07-06 DIAGNOSIS — R609 Edema, unspecified: Secondary | ICD-10-CM | POA: Diagnosis not present

## 2016-07-06 DIAGNOSIS — J441 Chronic obstructive pulmonary disease with (acute) exacerbation: Secondary | ICD-10-CM | POA: Diagnosis not present

## 2016-07-07 DIAGNOSIS — E119 Type 2 diabetes mellitus without complications: Secondary | ICD-10-CM | POA: Diagnosis not present

## 2016-07-07 DIAGNOSIS — S2241XD Multiple fractures of ribs, right side, subsequent encounter for fracture with routine healing: Secondary | ICD-10-CM | POA: Diagnosis not present

## 2016-07-07 DIAGNOSIS — K219 Gastro-esophageal reflux disease without esophagitis: Secondary | ICD-10-CM | POA: Diagnosis not present

## 2016-07-07 DIAGNOSIS — J309 Allergic rhinitis, unspecified: Secondary | ICD-10-CM | POA: Diagnosis not present

## 2016-07-07 DIAGNOSIS — Z7984 Long term (current) use of oral hypoglycemic drugs: Secondary | ICD-10-CM | POA: Diagnosis not present

## 2016-07-07 DIAGNOSIS — K5900 Constipation, unspecified: Secondary | ICD-10-CM | POA: Diagnosis not present

## 2016-07-07 DIAGNOSIS — J449 Chronic obstructive pulmonary disease, unspecified: Secondary | ICD-10-CM | POA: Diagnosis not present

## 2016-07-07 DIAGNOSIS — D649 Anemia, unspecified: Secondary | ICD-10-CM | POA: Diagnosis not present

## 2016-07-07 DIAGNOSIS — M810 Age-related osteoporosis without current pathological fracture: Secondary | ICD-10-CM | POA: Diagnosis not present

## 2016-07-09 DIAGNOSIS — S2241XD Multiple fractures of ribs, right side, subsequent encounter for fracture with routine healing: Secondary | ICD-10-CM | POA: Diagnosis not present

## 2016-07-09 DIAGNOSIS — M810 Age-related osteoporosis without current pathological fracture: Secondary | ICD-10-CM | POA: Diagnosis not present

## 2016-07-09 DIAGNOSIS — K219 Gastro-esophageal reflux disease without esophagitis: Secondary | ICD-10-CM | POA: Diagnosis not present

## 2016-07-09 DIAGNOSIS — Z7984 Long term (current) use of oral hypoglycemic drugs: Secondary | ICD-10-CM | POA: Diagnosis not present

## 2016-07-09 DIAGNOSIS — J309 Allergic rhinitis, unspecified: Secondary | ICD-10-CM | POA: Diagnosis not present

## 2016-07-09 DIAGNOSIS — J449 Chronic obstructive pulmonary disease, unspecified: Secondary | ICD-10-CM | POA: Diagnosis not present

## 2016-07-09 DIAGNOSIS — D649 Anemia, unspecified: Secondary | ICD-10-CM | POA: Diagnosis not present

## 2016-07-09 DIAGNOSIS — E119 Type 2 diabetes mellitus without complications: Secondary | ICD-10-CM | POA: Diagnosis not present

## 2016-07-09 DIAGNOSIS — K5900 Constipation, unspecified: Secondary | ICD-10-CM | POA: Diagnosis not present

## 2016-07-14 DIAGNOSIS — S2001XA Contusion of right breast, initial encounter: Secondary | ICD-10-CM | POA: Diagnosis not present

## 2016-07-14 DIAGNOSIS — Z7984 Long term (current) use of oral hypoglycemic drugs: Secondary | ICD-10-CM | POA: Diagnosis not present

## 2016-07-14 DIAGNOSIS — K5900 Constipation, unspecified: Secondary | ICD-10-CM | POA: Diagnosis not present

## 2016-07-14 DIAGNOSIS — E119 Type 2 diabetes mellitus without complications: Secondary | ICD-10-CM | POA: Diagnosis not present

## 2016-07-14 DIAGNOSIS — D649 Anemia, unspecified: Secondary | ICD-10-CM | POA: Diagnosis not present

## 2016-07-14 DIAGNOSIS — M810 Age-related osteoporosis without current pathological fracture: Secondary | ICD-10-CM | POA: Diagnosis not present

## 2016-07-14 DIAGNOSIS — J449 Chronic obstructive pulmonary disease, unspecified: Secondary | ICD-10-CM | POA: Diagnosis not present

## 2016-07-14 DIAGNOSIS — S2241XD Multiple fractures of ribs, right side, subsequent encounter for fracture with routine healing: Secondary | ICD-10-CM | POA: Diagnosis not present

## 2016-07-14 DIAGNOSIS — Z79899 Other long term (current) drug therapy: Secondary | ICD-10-CM | POA: Diagnosis not present

## 2016-07-14 DIAGNOSIS — J410 Simple chronic bronchitis: Secondary | ICD-10-CM | POA: Diagnosis not present

## 2016-07-14 DIAGNOSIS — J309 Allergic rhinitis, unspecified: Secondary | ICD-10-CM | POA: Diagnosis not present

## 2016-07-14 DIAGNOSIS — K219 Gastro-esophageal reflux disease without esophagitis: Secondary | ICD-10-CM | POA: Diagnosis not present

## 2016-07-16 DIAGNOSIS — E119 Type 2 diabetes mellitus without complications: Secondary | ICD-10-CM | POA: Diagnosis not present

## 2016-07-16 DIAGNOSIS — J309 Allergic rhinitis, unspecified: Secondary | ICD-10-CM | POA: Diagnosis not present

## 2016-07-16 DIAGNOSIS — J449 Chronic obstructive pulmonary disease, unspecified: Secondary | ICD-10-CM | POA: Diagnosis not present

## 2016-07-16 DIAGNOSIS — M810 Age-related osteoporosis without current pathological fracture: Secondary | ICD-10-CM | POA: Diagnosis not present

## 2016-07-16 DIAGNOSIS — S2241XD Multiple fractures of ribs, right side, subsequent encounter for fracture with routine healing: Secondary | ICD-10-CM | POA: Diagnosis not present

## 2016-07-16 DIAGNOSIS — K219 Gastro-esophageal reflux disease without esophagitis: Secondary | ICD-10-CM | POA: Diagnosis not present

## 2016-07-16 DIAGNOSIS — D649 Anemia, unspecified: Secondary | ICD-10-CM | POA: Diagnosis not present

## 2016-07-16 DIAGNOSIS — K5900 Constipation, unspecified: Secondary | ICD-10-CM | POA: Diagnosis not present

## 2016-07-16 DIAGNOSIS — Z7984 Long term (current) use of oral hypoglycemic drugs: Secondary | ICD-10-CM | POA: Diagnosis not present

## 2016-07-18 DIAGNOSIS — I6789 Other cerebrovascular disease: Secondary | ICD-10-CM | POA: Diagnosis not present

## 2016-07-18 DIAGNOSIS — R531 Weakness: Secondary | ICD-10-CM | POA: Diagnosis not present

## 2016-07-18 DIAGNOSIS — R29810 Facial weakness: Secondary | ICD-10-CM | POA: Diagnosis not present

## 2016-07-18 DIAGNOSIS — G459 Transient cerebral ischemic attack, unspecified: Secondary | ICD-10-CM | POA: Diagnosis not present

## 2016-07-20 DIAGNOSIS — D649 Anemia, unspecified: Secondary | ICD-10-CM | POA: Diagnosis not present

## 2016-07-20 DIAGNOSIS — M810 Age-related osteoporosis without current pathological fracture: Secondary | ICD-10-CM | POA: Diagnosis not present

## 2016-07-20 DIAGNOSIS — J449 Chronic obstructive pulmonary disease, unspecified: Secondary | ICD-10-CM | POA: Diagnosis not present

## 2016-07-20 DIAGNOSIS — K219 Gastro-esophageal reflux disease without esophagitis: Secondary | ICD-10-CM | POA: Diagnosis not present

## 2016-07-20 DIAGNOSIS — S2241XD Multiple fractures of ribs, right side, subsequent encounter for fracture with routine healing: Secondary | ICD-10-CM | POA: Diagnosis not present

## 2016-07-20 DIAGNOSIS — Z7984 Long term (current) use of oral hypoglycemic drugs: Secondary | ICD-10-CM | POA: Diagnosis not present

## 2016-07-20 DIAGNOSIS — E119 Type 2 diabetes mellitus without complications: Secondary | ICD-10-CM | POA: Diagnosis not present

## 2016-07-20 DIAGNOSIS — K5900 Constipation, unspecified: Secondary | ICD-10-CM | POA: Diagnosis not present

## 2016-07-20 DIAGNOSIS — J309 Allergic rhinitis, unspecified: Secondary | ICD-10-CM | POA: Diagnosis not present

## 2016-07-22 DIAGNOSIS — J449 Chronic obstructive pulmonary disease, unspecified: Secondary | ICD-10-CM | POA: Diagnosis not present

## 2016-07-22 DIAGNOSIS — S2241XD Multiple fractures of ribs, right side, subsequent encounter for fracture with routine healing: Secondary | ICD-10-CM | POA: Diagnosis not present

## 2016-07-22 DIAGNOSIS — K219 Gastro-esophageal reflux disease without esophagitis: Secondary | ICD-10-CM | POA: Diagnosis not present

## 2016-07-22 DIAGNOSIS — D649 Anemia, unspecified: Secondary | ICD-10-CM | POA: Diagnosis not present

## 2016-07-22 DIAGNOSIS — K5900 Constipation, unspecified: Secondary | ICD-10-CM | POA: Diagnosis not present

## 2016-07-22 DIAGNOSIS — Z7984 Long term (current) use of oral hypoglycemic drugs: Secondary | ICD-10-CM | POA: Diagnosis not present

## 2016-07-22 DIAGNOSIS — E119 Type 2 diabetes mellitus without complications: Secondary | ICD-10-CM | POA: Diagnosis not present

## 2016-07-22 DIAGNOSIS — M810 Age-related osteoporosis without current pathological fracture: Secondary | ICD-10-CM | POA: Diagnosis not present

## 2016-07-22 DIAGNOSIS — J309 Allergic rhinitis, unspecified: Secondary | ICD-10-CM | POA: Diagnosis not present

## 2016-07-27 DIAGNOSIS — K219 Gastro-esophageal reflux disease without esophagitis: Secondary | ICD-10-CM | POA: Diagnosis not present

## 2016-07-27 DIAGNOSIS — J449 Chronic obstructive pulmonary disease, unspecified: Secondary | ICD-10-CM | POA: Diagnosis not present

## 2016-07-27 DIAGNOSIS — K5900 Constipation, unspecified: Secondary | ICD-10-CM | POA: Diagnosis not present

## 2016-07-27 DIAGNOSIS — S2241XD Multiple fractures of ribs, right side, subsequent encounter for fracture with routine healing: Secondary | ICD-10-CM | POA: Diagnosis not present

## 2016-07-27 DIAGNOSIS — J309 Allergic rhinitis, unspecified: Secondary | ICD-10-CM | POA: Diagnosis not present

## 2016-07-27 DIAGNOSIS — E119 Type 2 diabetes mellitus without complications: Secondary | ICD-10-CM | POA: Diagnosis not present

## 2016-07-27 DIAGNOSIS — D649 Anemia, unspecified: Secondary | ICD-10-CM | POA: Diagnosis not present

## 2016-07-27 DIAGNOSIS — M810 Age-related osteoporosis without current pathological fracture: Secondary | ICD-10-CM | POA: Diagnosis not present

## 2016-07-27 DIAGNOSIS — Z7984 Long term (current) use of oral hypoglycemic drugs: Secondary | ICD-10-CM | POA: Diagnosis not present

## 2016-07-28 DIAGNOSIS — R569 Unspecified convulsions: Secondary | ICD-10-CM | POA: Diagnosis not present

## 2016-07-28 DIAGNOSIS — G44229 Chronic tension-type headache, not intractable: Secondary | ICD-10-CM | POA: Diagnosis not present

## 2016-07-28 DIAGNOSIS — I639 Cerebral infarction, unspecified: Secondary | ICD-10-CM | POA: Diagnosis not present

## 2016-07-28 DIAGNOSIS — D509 Iron deficiency anemia, unspecified: Secondary | ICD-10-CM | POA: Diagnosis not present

## 2016-07-29 DIAGNOSIS — D649 Anemia, unspecified: Secondary | ICD-10-CM | POA: Diagnosis not present

## 2016-07-29 DIAGNOSIS — K219 Gastro-esophageal reflux disease without esophagitis: Secondary | ICD-10-CM | POA: Diagnosis not present

## 2016-07-29 DIAGNOSIS — M810 Age-related osteoporosis without current pathological fracture: Secondary | ICD-10-CM | POA: Diagnosis not present

## 2016-07-29 DIAGNOSIS — J309 Allergic rhinitis, unspecified: Secondary | ICD-10-CM | POA: Diagnosis not present

## 2016-07-29 DIAGNOSIS — E119 Type 2 diabetes mellitus without complications: Secondary | ICD-10-CM | POA: Diagnosis not present

## 2016-07-29 DIAGNOSIS — Z7984 Long term (current) use of oral hypoglycemic drugs: Secondary | ICD-10-CM | POA: Diagnosis not present

## 2016-07-29 DIAGNOSIS — S2241XD Multiple fractures of ribs, right side, subsequent encounter for fracture with routine healing: Secondary | ICD-10-CM | POA: Diagnosis not present

## 2016-07-29 DIAGNOSIS — K5900 Constipation, unspecified: Secondary | ICD-10-CM | POA: Diagnosis not present

## 2016-07-29 DIAGNOSIS — J449 Chronic obstructive pulmonary disease, unspecified: Secondary | ICD-10-CM | POA: Diagnosis not present

## 2016-08-02 DIAGNOSIS — S2241XD Multiple fractures of ribs, right side, subsequent encounter for fracture with routine healing: Secondary | ICD-10-CM | POA: Diagnosis not present

## 2016-08-02 DIAGNOSIS — R918 Other nonspecific abnormal finding of lung field: Secondary | ICD-10-CM | POA: Diagnosis not present

## 2016-08-03 DIAGNOSIS — K5900 Constipation, unspecified: Secondary | ICD-10-CM | POA: Diagnosis not present

## 2016-08-03 DIAGNOSIS — S2241XD Multiple fractures of ribs, right side, subsequent encounter for fracture with routine healing: Secondary | ICD-10-CM | POA: Diagnosis not present

## 2016-08-03 DIAGNOSIS — K219 Gastro-esophageal reflux disease without esophagitis: Secondary | ICD-10-CM | POA: Diagnosis not present

## 2016-08-03 DIAGNOSIS — D649 Anemia, unspecified: Secondary | ICD-10-CM | POA: Diagnosis not present

## 2016-08-03 DIAGNOSIS — M810 Age-related osteoporosis without current pathological fracture: Secondary | ICD-10-CM | POA: Diagnosis not present

## 2016-08-03 DIAGNOSIS — Z7984 Long term (current) use of oral hypoglycemic drugs: Secondary | ICD-10-CM | POA: Diagnosis not present

## 2016-08-03 DIAGNOSIS — E119 Type 2 diabetes mellitus without complications: Secondary | ICD-10-CM | POA: Diagnosis not present

## 2016-08-03 DIAGNOSIS — J449 Chronic obstructive pulmonary disease, unspecified: Secondary | ICD-10-CM | POA: Diagnosis not present

## 2016-08-03 DIAGNOSIS — J309 Allergic rhinitis, unspecified: Secondary | ICD-10-CM | POA: Diagnosis not present

## 2016-08-06 DIAGNOSIS — E119 Type 2 diabetes mellitus without complications: Secondary | ICD-10-CM | POA: Diagnosis not present

## 2016-08-06 DIAGNOSIS — M810 Age-related osteoporosis without current pathological fracture: Secondary | ICD-10-CM | POA: Diagnosis not present

## 2016-08-06 DIAGNOSIS — J449 Chronic obstructive pulmonary disease, unspecified: Secondary | ICD-10-CM | POA: Diagnosis not present

## 2016-08-06 DIAGNOSIS — Z7984 Long term (current) use of oral hypoglycemic drugs: Secondary | ICD-10-CM | POA: Diagnosis not present

## 2016-08-06 DIAGNOSIS — K5900 Constipation, unspecified: Secondary | ICD-10-CM | POA: Diagnosis not present

## 2016-08-06 DIAGNOSIS — J9811 Atelectasis: Secondary | ICD-10-CM | POA: Diagnosis not present

## 2016-08-06 DIAGNOSIS — S2249XA Multiple fractures of ribs, unspecified side, initial encounter for closed fracture: Secondary | ICD-10-CM | POA: Diagnosis not present

## 2016-08-06 DIAGNOSIS — S2241XD Multiple fractures of ribs, right side, subsequent encounter for fracture with routine healing: Secondary | ICD-10-CM | POA: Diagnosis not present

## 2016-08-06 DIAGNOSIS — G4733 Obstructive sleep apnea (adult) (pediatric): Secondary | ICD-10-CM | POA: Diagnosis not present

## 2016-08-06 DIAGNOSIS — D649 Anemia, unspecified: Secondary | ICD-10-CM | POA: Diagnosis not present

## 2016-08-06 DIAGNOSIS — J309 Allergic rhinitis, unspecified: Secondary | ICD-10-CM | POA: Diagnosis not present

## 2016-08-06 DIAGNOSIS — K219 Gastro-esophageal reflux disease without esophagitis: Secondary | ICD-10-CM | POA: Diagnosis not present

## 2016-08-10 DIAGNOSIS — K5900 Constipation, unspecified: Secondary | ICD-10-CM | POA: Diagnosis not present

## 2016-08-10 DIAGNOSIS — J309 Allergic rhinitis, unspecified: Secondary | ICD-10-CM | POA: Diagnosis not present

## 2016-08-10 DIAGNOSIS — Z7984 Long term (current) use of oral hypoglycemic drugs: Secondary | ICD-10-CM | POA: Diagnosis not present

## 2016-08-10 DIAGNOSIS — K219 Gastro-esophageal reflux disease without esophagitis: Secondary | ICD-10-CM | POA: Diagnosis not present

## 2016-08-10 DIAGNOSIS — S2241XD Multiple fractures of ribs, right side, subsequent encounter for fracture with routine healing: Secondary | ICD-10-CM | POA: Diagnosis not present

## 2016-08-10 DIAGNOSIS — J449 Chronic obstructive pulmonary disease, unspecified: Secondary | ICD-10-CM | POA: Diagnosis not present

## 2016-08-10 DIAGNOSIS — D649 Anemia, unspecified: Secondary | ICD-10-CM | POA: Diagnosis not present

## 2016-08-10 DIAGNOSIS — E119 Type 2 diabetes mellitus without complications: Secondary | ICD-10-CM | POA: Diagnosis not present

## 2016-08-10 DIAGNOSIS — M810 Age-related osteoporosis without current pathological fracture: Secondary | ICD-10-CM | POA: Diagnosis not present

## 2016-08-12 DIAGNOSIS — I10 Essential (primary) hypertension: Secondary | ICD-10-CM | POA: Diagnosis not present

## 2016-08-12 DIAGNOSIS — Z7982 Long term (current) use of aspirin: Secondary | ICD-10-CM | POA: Diagnosis not present

## 2016-08-12 DIAGNOSIS — G8929 Other chronic pain: Secondary | ICD-10-CM | POA: Diagnosis not present

## 2016-08-12 DIAGNOSIS — M1990 Unspecified osteoarthritis, unspecified site: Secondary | ICD-10-CM | POA: Diagnosis not present

## 2016-08-12 DIAGNOSIS — Z7984 Long term (current) use of oral hypoglycemic drugs: Secondary | ICD-10-CM | POA: Diagnosis not present

## 2016-08-12 DIAGNOSIS — Z8673 Personal history of transient ischemic attack (TIA), and cerebral infarction without residual deficits: Secondary | ICD-10-CM | POA: Diagnosis not present

## 2016-08-12 DIAGNOSIS — D649 Anemia, unspecified: Secondary | ICD-10-CM | POA: Diagnosis not present

## 2016-08-12 DIAGNOSIS — K5900 Constipation, unspecified: Secondary | ICD-10-CM | POA: Diagnosis not present

## 2016-08-12 DIAGNOSIS — R410 Disorientation, unspecified: Secondary | ICD-10-CM | POA: Diagnosis not present

## 2016-08-12 DIAGNOSIS — T402X1A Poisoning by other opioids, accidental (unintentional), initial encounter: Secondary | ICD-10-CM | POA: Diagnosis not present

## 2016-08-12 DIAGNOSIS — Z87891 Personal history of nicotine dependence: Secondary | ICD-10-CM | POA: Diagnosis not present

## 2016-08-12 DIAGNOSIS — Z888 Allergy status to other drugs, medicaments and biological substances status: Secondary | ICD-10-CM | POA: Diagnosis not present

## 2016-08-12 DIAGNOSIS — M810 Age-related osteoporosis without current pathological fracture: Secondary | ICD-10-CM | POA: Diagnosis not present

## 2016-08-12 DIAGNOSIS — Z9181 History of falling: Secondary | ICD-10-CM | POA: Diagnosis not present

## 2016-08-12 DIAGNOSIS — S2241XD Multiple fractures of ribs, right side, subsequent encounter for fracture with routine healing: Secondary | ICD-10-CM | POA: Diagnosis not present

## 2016-08-12 DIAGNOSIS — K921 Melena: Secondary | ICD-10-CM | POA: Diagnosis not present

## 2016-08-12 DIAGNOSIS — T50904A Poisoning by unspecified drugs, medicaments and biological substances, undetermined, initial encounter: Secondary | ICD-10-CM | POA: Diagnosis not present

## 2016-08-12 DIAGNOSIS — J309 Allergic rhinitis, unspecified: Secondary | ICD-10-CM | POA: Diagnosis not present

## 2016-08-12 DIAGNOSIS — E119 Type 2 diabetes mellitus without complications: Secondary | ICD-10-CM | POA: Diagnosis not present

## 2016-08-12 DIAGNOSIS — M542 Cervicalgia: Secondary | ICD-10-CM | POA: Diagnosis not present

## 2016-08-12 DIAGNOSIS — J449 Chronic obstructive pulmonary disease, unspecified: Secondary | ICD-10-CM | POA: Diagnosis not present

## 2016-08-12 DIAGNOSIS — K922 Gastrointestinal hemorrhage, unspecified: Secondary | ICD-10-CM | POA: Diagnosis not present

## 2016-08-12 DIAGNOSIS — R05 Cough: Secondary | ICD-10-CM | POA: Diagnosis not present

## 2016-08-12 DIAGNOSIS — Z881 Allergy status to other antibiotic agents status: Secondary | ICD-10-CM | POA: Diagnosis not present

## 2016-08-12 DIAGNOSIS — E538 Deficiency of other specified B group vitamins: Secondary | ICD-10-CM | POA: Diagnosis not present

## 2016-08-12 DIAGNOSIS — J441 Chronic obstructive pulmonary disease with (acute) exacerbation: Secondary | ICD-10-CM | POA: Diagnosis not present

## 2016-08-12 DIAGNOSIS — Z880 Allergy status to penicillin: Secondary | ICD-10-CM | POA: Diagnosis not present

## 2016-08-12 DIAGNOSIS — D62 Acute posthemorrhagic anemia: Secondary | ICD-10-CM | POA: Diagnosis not present

## 2016-08-12 DIAGNOSIS — G92 Toxic encephalopathy: Secondary | ICD-10-CM | POA: Diagnosis not present

## 2016-08-12 DIAGNOSIS — E785 Hyperlipidemia, unspecified: Secondary | ICD-10-CM | POA: Diagnosis not present

## 2016-08-12 DIAGNOSIS — Z79899 Other long term (current) drug therapy: Secondary | ICD-10-CM | POA: Diagnosis not present

## 2016-08-12 DIAGNOSIS — K219 Gastro-esophageal reflux disease without esophagitis: Secondary | ICD-10-CM | POA: Diagnosis not present

## 2016-08-13 DIAGNOSIS — K922 Gastrointestinal hemorrhage, unspecified: Secondary | ICD-10-CM

## 2016-08-13 DIAGNOSIS — J449 Chronic obstructive pulmonary disease, unspecified: Secondary | ICD-10-CM | POA: Diagnosis not present

## 2016-08-13 DIAGNOSIS — R410 Disorientation, unspecified: Secondary | ICD-10-CM | POA: Diagnosis not present

## 2016-08-13 DIAGNOSIS — D649 Anemia, unspecified: Secondary | ICD-10-CM | POA: Diagnosis not present

## 2016-08-18 DIAGNOSIS — Z7984 Long term (current) use of oral hypoglycemic drugs: Secondary | ICD-10-CM | POA: Diagnosis not present

## 2016-08-18 DIAGNOSIS — J441 Chronic obstructive pulmonary disease with (acute) exacerbation: Secondary | ICD-10-CM | POA: Diagnosis not present

## 2016-08-18 DIAGNOSIS — M1991 Primary osteoarthritis, unspecified site: Secondary | ICD-10-CM | POA: Diagnosis not present

## 2016-08-18 DIAGNOSIS — M109 Gout, unspecified: Secondary | ICD-10-CM | POA: Diagnosis not present

## 2016-08-18 DIAGNOSIS — E119 Type 2 diabetes mellitus without complications: Secondary | ICD-10-CM | POA: Diagnosis not present

## 2016-08-18 DIAGNOSIS — I10 Essential (primary) hypertension: Secondary | ICD-10-CM | POA: Diagnosis not present

## 2016-08-18 DIAGNOSIS — Z7951 Long term (current) use of inhaled steroids: Secondary | ICD-10-CM | POA: Diagnosis not present

## 2016-08-18 DIAGNOSIS — Z8673 Personal history of transient ischemic attack (TIA), and cerebral infarction without residual deficits: Secondary | ICD-10-CM | POA: Diagnosis not present

## 2016-08-18 DIAGNOSIS — R569 Unspecified convulsions: Secondary | ICD-10-CM | POA: Diagnosis not present

## 2016-08-18 DIAGNOSIS — D649 Anemia, unspecified: Secondary | ICD-10-CM | POA: Diagnosis not present

## 2016-08-22 DIAGNOSIS — E119 Type 2 diabetes mellitus without complications: Secondary | ICD-10-CM | POA: Diagnosis not present

## 2016-08-22 DIAGNOSIS — Z8673 Personal history of transient ischemic attack (TIA), and cerebral infarction without residual deficits: Secondary | ICD-10-CM | POA: Diagnosis not present

## 2016-08-22 DIAGNOSIS — M1991 Primary osteoarthritis, unspecified site: Secondary | ICD-10-CM | POA: Diagnosis not present

## 2016-08-22 DIAGNOSIS — M109 Gout, unspecified: Secondary | ICD-10-CM | POA: Diagnosis not present

## 2016-08-22 DIAGNOSIS — D649 Anemia, unspecified: Secondary | ICD-10-CM | POA: Diagnosis not present

## 2016-08-22 DIAGNOSIS — R569 Unspecified convulsions: Secondary | ICD-10-CM | POA: Diagnosis not present

## 2016-08-22 DIAGNOSIS — I10 Essential (primary) hypertension: Secondary | ICD-10-CM | POA: Diagnosis not present

## 2016-08-22 DIAGNOSIS — Z7951 Long term (current) use of inhaled steroids: Secondary | ICD-10-CM | POA: Diagnosis not present

## 2016-08-22 DIAGNOSIS — J441 Chronic obstructive pulmonary disease with (acute) exacerbation: Secondary | ICD-10-CM | POA: Diagnosis not present

## 2016-08-22 DIAGNOSIS — Z7984 Long term (current) use of oral hypoglycemic drugs: Secondary | ICD-10-CM | POA: Diagnosis not present

## 2016-08-24 DIAGNOSIS — Z23 Encounter for immunization: Secondary | ICD-10-CM | POA: Diagnosis not present

## 2016-08-24 DIAGNOSIS — J441 Chronic obstructive pulmonary disease with (acute) exacerbation: Secondary | ICD-10-CM | POA: Diagnosis not present

## 2016-08-24 DIAGNOSIS — G92 Toxic encephalopathy: Secondary | ICD-10-CM | POA: Diagnosis not present

## 2016-08-24 DIAGNOSIS — D500 Iron deficiency anemia secondary to blood loss (chronic): Secondary | ICD-10-CM | POA: Diagnosis not present

## 2016-08-24 DIAGNOSIS — I10 Essential (primary) hypertension: Secondary | ICD-10-CM | POA: Diagnosis not present

## 2016-08-25 DIAGNOSIS — R569 Unspecified convulsions: Secondary | ICD-10-CM | POA: Diagnosis not present

## 2016-08-25 DIAGNOSIS — E119 Type 2 diabetes mellitus without complications: Secondary | ICD-10-CM | POA: Diagnosis not present

## 2016-08-25 DIAGNOSIS — I10 Essential (primary) hypertension: Secondary | ICD-10-CM | POA: Diagnosis not present

## 2016-08-25 DIAGNOSIS — J441 Chronic obstructive pulmonary disease with (acute) exacerbation: Secondary | ICD-10-CM | POA: Diagnosis not present

## 2016-08-25 DIAGNOSIS — M1991 Primary osteoarthritis, unspecified site: Secondary | ICD-10-CM | POA: Diagnosis not present

## 2016-08-25 DIAGNOSIS — Z7951 Long term (current) use of inhaled steroids: Secondary | ICD-10-CM | POA: Diagnosis not present

## 2016-08-25 DIAGNOSIS — Z7984 Long term (current) use of oral hypoglycemic drugs: Secondary | ICD-10-CM | POA: Diagnosis not present

## 2016-08-25 DIAGNOSIS — D649 Anemia, unspecified: Secondary | ICD-10-CM | POA: Diagnosis not present

## 2016-08-25 DIAGNOSIS — M109 Gout, unspecified: Secondary | ICD-10-CM | POA: Diagnosis not present

## 2016-08-25 DIAGNOSIS — Z8673 Personal history of transient ischemic attack (TIA), and cerebral infarction without residual deficits: Secondary | ICD-10-CM | POA: Diagnosis not present

## 2016-08-26 DIAGNOSIS — R531 Weakness: Secondary | ICD-10-CM | POA: Diagnosis not present

## 2016-08-26 DIAGNOSIS — G459 Transient cerebral ischemic attack, unspecified: Secondary | ICD-10-CM | POA: Diagnosis not present

## 2016-08-26 DIAGNOSIS — I6789 Other cerebrovascular disease: Secondary | ICD-10-CM | POA: Diagnosis not present

## 2016-08-26 DIAGNOSIS — R251 Tremor, unspecified: Secondary | ICD-10-CM | POA: Diagnosis not present

## 2016-08-26 DIAGNOSIS — R29810 Facial weakness: Secondary | ICD-10-CM | POA: Diagnosis not present

## 2016-08-26 DIAGNOSIS — Z791 Long term (current) use of non-steroidal anti-inflammatories (NSAID): Secondary | ICD-10-CM | POA: Diagnosis not present

## 2016-08-26 DIAGNOSIS — D649 Anemia, unspecified: Secondary | ICD-10-CM | POA: Diagnosis not present

## 2016-08-26 DIAGNOSIS — M79641 Pain in right hand: Secondary | ICD-10-CM | POA: Diagnosis not present

## 2016-08-26 DIAGNOSIS — R0600 Dyspnea, unspecified: Secondary | ICD-10-CM | POA: Diagnosis not present

## 2016-08-26 DIAGNOSIS — R0602 Shortness of breath: Secondary | ICD-10-CM | POA: Diagnosis not present

## 2016-08-26 DIAGNOSIS — Z8673 Personal history of transient ischemic attack (TIA), and cerebral infarction without residual deficits: Secondary | ICD-10-CM | POA: Diagnosis not present

## 2016-08-27 DIAGNOSIS — D649 Anemia, unspecified: Secondary | ICD-10-CM | POA: Diagnosis not present

## 2016-08-27 DIAGNOSIS — M109 Gout, unspecified: Secondary | ICD-10-CM | POA: Diagnosis not present

## 2016-08-27 DIAGNOSIS — I10 Essential (primary) hypertension: Secondary | ICD-10-CM | POA: Diagnosis not present

## 2016-08-27 DIAGNOSIS — R569 Unspecified convulsions: Secondary | ICD-10-CM | POA: Diagnosis not present

## 2016-08-27 DIAGNOSIS — Z8673 Personal history of transient ischemic attack (TIA), and cerebral infarction without residual deficits: Secondary | ICD-10-CM | POA: Diagnosis not present

## 2016-08-27 DIAGNOSIS — E119 Type 2 diabetes mellitus without complications: Secondary | ICD-10-CM | POA: Diagnosis not present

## 2016-08-27 DIAGNOSIS — Z7984 Long term (current) use of oral hypoglycemic drugs: Secondary | ICD-10-CM | POA: Diagnosis not present

## 2016-08-27 DIAGNOSIS — Z7951 Long term (current) use of inhaled steroids: Secondary | ICD-10-CM | POA: Diagnosis not present

## 2016-08-27 DIAGNOSIS — J441 Chronic obstructive pulmonary disease with (acute) exacerbation: Secondary | ICD-10-CM | POA: Diagnosis not present

## 2016-08-27 DIAGNOSIS — M1991 Primary osteoarthritis, unspecified site: Secondary | ICD-10-CM | POA: Diagnosis not present

## 2016-08-31 DIAGNOSIS — Z8673 Personal history of transient ischemic attack (TIA), and cerebral infarction without residual deficits: Secondary | ICD-10-CM | POA: Diagnosis not present

## 2016-08-31 DIAGNOSIS — Z7951 Long term (current) use of inhaled steroids: Secondary | ICD-10-CM | POA: Diagnosis not present

## 2016-08-31 DIAGNOSIS — M109 Gout, unspecified: Secondary | ICD-10-CM | POA: Diagnosis not present

## 2016-08-31 DIAGNOSIS — D649 Anemia, unspecified: Secondary | ICD-10-CM | POA: Diagnosis not present

## 2016-08-31 DIAGNOSIS — M1991 Primary osteoarthritis, unspecified site: Secondary | ICD-10-CM | POA: Diagnosis not present

## 2016-08-31 DIAGNOSIS — J441 Chronic obstructive pulmonary disease with (acute) exacerbation: Secondary | ICD-10-CM | POA: Diagnosis not present

## 2016-08-31 DIAGNOSIS — R569 Unspecified convulsions: Secondary | ICD-10-CM | POA: Diagnosis not present

## 2016-08-31 DIAGNOSIS — I10 Essential (primary) hypertension: Secondary | ICD-10-CM | POA: Diagnosis not present

## 2016-08-31 DIAGNOSIS — Z7984 Long term (current) use of oral hypoglycemic drugs: Secondary | ICD-10-CM | POA: Diagnosis not present

## 2016-08-31 DIAGNOSIS — E119 Type 2 diabetes mellitus without complications: Secondary | ICD-10-CM | POA: Diagnosis not present

## 2016-09-01 DIAGNOSIS — R569 Unspecified convulsions: Secondary | ICD-10-CM | POA: Diagnosis not present

## 2016-09-01 DIAGNOSIS — M109 Gout, unspecified: Secondary | ICD-10-CM | POA: Diagnosis not present

## 2016-09-01 DIAGNOSIS — J441 Chronic obstructive pulmonary disease with (acute) exacerbation: Secondary | ICD-10-CM | POA: Diagnosis not present

## 2016-09-01 DIAGNOSIS — D649 Anemia, unspecified: Secondary | ICD-10-CM | POA: Diagnosis not present

## 2016-09-01 DIAGNOSIS — I10 Essential (primary) hypertension: Secondary | ICD-10-CM | POA: Diagnosis not present

## 2016-09-01 DIAGNOSIS — M1991 Primary osteoarthritis, unspecified site: Secondary | ICD-10-CM | POA: Diagnosis not present

## 2016-09-01 DIAGNOSIS — E119 Type 2 diabetes mellitus without complications: Secondary | ICD-10-CM | POA: Diagnosis not present

## 2016-09-01 DIAGNOSIS — Z8673 Personal history of transient ischemic attack (TIA), and cerebral infarction without residual deficits: Secondary | ICD-10-CM | POA: Diagnosis not present

## 2016-09-01 DIAGNOSIS — Z7984 Long term (current) use of oral hypoglycemic drugs: Secondary | ICD-10-CM | POA: Diagnosis not present

## 2016-09-01 DIAGNOSIS — Z7951 Long term (current) use of inhaled steroids: Secondary | ICD-10-CM | POA: Diagnosis not present

## 2016-09-03 DIAGNOSIS — Z8673 Personal history of transient ischemic attack (TIA), and cerebral infarction without residual deficits: Secondary | ICD-10-CM | POA: Diagnosis not present

## 2016-09-03 DIAGNOSIS — D649 Anemia, unspecified: Secondary | ICD-10-CM | POA: Diagnosis not present

## 2016-09-03 DIAGNOSIS — I10 Essential (primary) hypertension: Secondary | ICD-10-CM | POA: Diagnosis not present

## 2016-09-03 DIAGNOSIS — E119 Type 2 diabetes mellitus without complications: Secondary | ICD-10-CM | POA: Diagnosis not present

## 2016-09-03 DIAGNOSIS — Z7984 Long term (current) use of oral hypoglycemic drugs: Secondary | ICD-10-CM | POA: Diagnosis not present

## 2016-09-03 DIAGNOSIS — M109 Gout, unspecified: Secondary | ICD-10-CM | POA: Diagnosis not present

## 2016-09-03 DIAGNOSIS — Z7951 Long term (current) use of inhaled steroids: Secondary | ICD-10-CM | POA: Diagnosis not present

## 2016-09-03 DIAGNOSIS — M1991 Primary osteoarthritis, unspecified site: Secondary | ICD-10-CM | POA: Diagnosis not present

## 2016-09-03 DIAGNOSIS — J441 Chronic obstructive pulmonary disease with (acute) exacerbation: Secondary | ICD-10-CM | POA: Diagnosis not present

## 2016-09-03 DIAGNOSIS — R569 Unspecified convulsions: Secondary | ICD-10-CM | POA: Diagnosis not present

## 2016-09-07 DIAGNOSIS — J441 Chronic obstructive pulmonary disease with (acute) exacerbation: Secondary | ICD-10-CM | POA: Diagnosis not present

## 2016-09-08 DIAGNOSIS — Z7951 Long term (current) use of inhaled steroids: Secondary | ICD-10-CM | POA: Diagnosis not present

## 2016-09-08 DIAGNOSIS — M1991 Primary osteoarthritis, unspecified site: Secondary | ICD-10-CM | POA: Diagnosis not present

## 2016-09-08 DIAGNOSIS — E119 Type 2 diabetes mellitus without complications: Secondary | ICD-10-CM | POA: Diagnosis not present

## 2016-09-08 DIAGNOSIS — J441 Chronic obstructive pulmonary disease with (acute) exacerbation: Secondary | ICD-10-CM | POA: Diagnosis not present

## 2016-09-08 DIAGNOSIS — I10 Essential (primary) hypertension: Secondary | ICD-10-CM | POA: Diagnosis not present

## 2016-09-08 DIAGNOSIS — M109 Gout, unspecified: Secondary | ICD-10-CM | POA: Diagnosis not present

## 2016-09-08 DIAGNOSIS — Z8673 Personal history of transient ischemic attack (TIA), and cerebral infarction without residual deficits: Secondary | ICD-10-CM | POA: Diagnosis not present

## 2016-09-08 DIAGNOSIS — D649 Anemia, unspecified: Secondary | ICD-10-CM | POA: Diagnosis not present

## 2016-09-08 DIAGNOSIS — Z7984 Long term (current) use of oral hypoglycemic drugs: Secondary | ICD-10-CM | POA: Diagnosis not present

## 2016-09-08 DIAGNOSIS — R569 Unspecified convulsions: Secondary | ICD-10-CM | POA: Diagnosis not present

## 2016-09-09 DIAGNOSIS — J449 Chronic obstructive pulmonary disease, unspecified: Secondary | ICD-10-CM | POA: Diagnosis not present

## 2016-09-09 DIAGNOSIS — J441 Chronic obstructive pulmonary disease with (acute) exacerbation: Secondary | ICD-10-CM | POA: Diagnosis not present

## 2016-09-09 DIAGNOSIS — J0101 Acute recurrent maxillary sinusitis: Secondary | ICD-10-CM | POA: Diagnosis not present

## 2016-09-10 DIAGNOSIS — M109 Gout, unspecified: Secondary | ICD-10-CM | POA: Diagnosis not present

## 2016-09-10 DIAGNOSIS — I10 Essential (primary) hypertension: Secondary | ICD-10-CM | POA: Diagnosis not present

## 2016-09-10 DIAGNOSIS — D649 Anemia, unspecified: Secondary | ICD-10-CM | POA: Diagnosis not present

## 2016-09-10 DIAGNOSIS — Z7984 Long term (current) use of oral hypoglycemic drugs: Secondary | ICD-10-CM | POA: Diagnosis not present

## 2016-09-10 DIAGNOSIS — Z8673 Personal history of transient ischemic attack (TIA), and cerebral infarction without residual deficits: Secondary | ICD-10-CM | POA: Diagnosis not present

## 2016-09-10 DIAGNOSIS — J441 Chronic obstructive pulmonary disease with (acute) exacerbation: Secondary | ICD-10-CM | POA: Diagnosis not present

## 2016-09-10 DIAGNOSIS — E119 Type 2 diabetes mellitus without complications: Secondary | ICD-10-CM | POA: Diagnosis not present

## 2016-09-10 DIAGNOSIS — M1991 Primary osteoarthritis, unspecified site: Secondary | ICD-10-CM | POA: Diagnosis not present

## 2016-09-10 DIAGNOSIS — R569 Unspecified convulsions: Secondary | ICD-10-CM | POA: Diagnosis not present

## 2016-09-10 DIAGNOSIS — Z7951 Long term (current) use of inhaled steroids: Secondary | ICD-10-CM | POA: Diagnosis not present

## 2016-09-14 DIAGNOSIS — Z7951 Long term (current) use of inhaled steroids: Secondary | ICD-10-CM | POA: Diagnosis not present

## 2016-09-14 DIAGNOSIS — Z7984 Long term (current) use of oral hypoglycemic drugs: Secondary | ICD-10-CM | POA: Diagnosis not present

## 2016-09-14 DIAGNOSIS — D649 Anemia, unspecified: Secondary | ICD-10-CM | POA: Diagnosis not present

## 2016-09-14 DIAGNOSIS — Z8673 Personal history of transient ischemic attack (TIA), and cerebral infarction without residual deficits: Secondary | ICD-10-CM | POA: Diagnosis not present

## 2016-09-14 DIAGNOSIS — E119 Type 2 diabetes mellitus without complications: Secondary | ICD-10-CM | POA: Diagnosis not present

## 2016-09-14 DIAGNOSIS — M1991 Primary osteoarthritis, unspecified site: Secondary | ICD-10-CM | POA: Diagnosis not present

## 2016-09-14 DIAGNOSIS — I10 Essential (primary) hypertension: Secondary | ICD-10-CM | POA: Diagnosis not present

## 2016-09-14 DIAGNOSIS — R569 Unspecified convulsions: Secondary | ICD-10-CM | POA: Diagnosis not present

## 2016-09-14 DIAGNOSIS — J441 Chronic obstructive pulmonary disease with (acute) exacerbation: Secondary | ICD-10-CM | POA: Diagnosis not present

## 2016-09-14 DIAGNOSIS — M109 Gout, unspecified: Secondary | ICD-10-CM | POA: Diagnosis not present

## 2016-09-16 DIAGNOSIS — Z7984 Long term (current) use of oral hypoglycemic drugs: Secondary | ICD-10-CM | POA: Diagnosis not present

## 2016-09-16 DIAGNOSIS — I10 Essential (primary) hypertension: Secondary | ICD-10-CM | POA: Diagnosis not present

## 2016-09-16 DIAGNOSIS — E119 Type 2 diabetes mellitus without complications: Secondary | ICD-10-CM | POA: Diagnosis not present

## 2016-09-16 DIAGNOSIS — D649 Anemia, unspecified: Secondary | ICD-10-CM | POA: Diagnosis not present

## 2016-09-16 DIAGNOSIS — R569 Unspecified convulsions: Secondary | ICD-10-CM | POA: Diagnosis not present

## 2016-09-16 DIAGNOSIS — M1991 Primary osteoarthritis, unspecified site: Secondary | ICD-10-CM | POA: Diagnosis not present

## 2016-09-16 DIAGNOSIS — M109 Gout, unspecified: Secondary | ICD-10-CM | POA: Diagnosis not present

## 2016-09-16 DIAGNOSIS — J441 Chronic obstructive pulmonary disease with (acute) exacerbation: Secondary | ICD-10-CM | POA: Diagnosis not present

## 2016-09-16 DIAGNOSIS — J449 Chronic obstructive pulmonary disease, unspecified: Secondary | ICD-10-CM | POA: Diagnosis not present

## 2016-09-16 DIAGNOSIS — Z8673 Personal history of transient ischemic attack (TIA), and cerebral infarction without residual deficits: Secondary | ICD-10-CM | POA: Diagnosis not present

## 2016-09-16 DIAGNOSIS — Z7951 Long term (current) use of inhaled steroids: Secondary | ICD-10-CM | POA: Diagnosis not present

## 2016-09-20 DIAGNOSIS — Z7984 Long term (current) use of oral hypoglycemic drugs: Secondary | ICD-10-CM | POA: Diagnosis not present

## 2016-09-20 DIAGNOSIS — M1991 Primary osteoarthritis, unspecified site: Secondary | ICD-10-CM | POA: Diagnosis not present

## 2016-09-20 DIAGNOSIS — E119 Type 2 diabetes mellitus without complications: Secondary | ICD-10-CM | POA: Diagnosis not present

## 2016-09-20 DIAGNOSIS — I10 Essential (primary) hypertension: Secondary | ICD-10-CM | POA: Diagnosis not present

## 2016-09-20 DIAGNOSIS — R569 Unspecified convulsions: Secondary | ICD-10-CM | POA: Diagnosis not present

## 2016-09-20 DIAGNOSIS — Z7951 Long term (current) use of inhaled steroids: Secondary | ICD-10-CM | POA: Diagnosis not present

## 2016-09-20 DIAGNOSIS — J441 Chronic obstructive pulmonary disease with (acute) exacerbation: Secondary | ICD-10-CM | POA: Diagnosis not present

## 2016-09-20 DIAGNOSIS — D649 Anemia, unspecified: Secondary | ICD-10-CM | POA: Diagnosis not present

## 2016-09-20 DIAGNOSIS — M109 Gout, unspecified: Secondary | ICD-10-CM | POA: Diagnosis not present

## 2016-09-20 DIAGNOSIS — Z8673 Personal history of transient ischemic attack (TIA), and cerebral infarction without residual deficits: Secondary | ICD-10-CM | POA: Diagnosis not present

## 2016-09-21 DIAGNOSIS — D649 Anemia, unspecified: Secondary | ICD-10-CM | POA: Diagnosis not present

## 2016-09-21 DIAGNOSIS — Z7984 Long term (current) use of oral hypoglycemic drugs: Secondary | ICD-10-CM | POA: Diagnosis not present

## 2016-09-21 DIAGNOSIS — R569 Unspecified convulsions: Secondary | ICD-10-CM | POA: Diagnosis not present

## 2016-09-21 DIAGNOSIS — M109 Gout, unspecified: Secondary | ICD-10-CM | POA: Diagnosis not present

## 2016-09-21 DIAGNOSIS — I10 Essential (primary) hypertension: Secondary | ICD-10-CM | POA: Diagnosis not present

## 2016-09-21 DIAGNOSIS — Z8673 Personal history of transient ischemic attack (TIA), and cerebral infarction without residual deficits: Secondary | ICD-10-CM | POA: Diagnosis not present

## 2016-09-21 DIAGNOSIS — Z7951 Long term (current) use of inhaled steroids: Secondary | ICD-10-CM | POA: Diagnosis not present

## 2016-09-21 DIAGNOSIS — M1991 Primary osteoarthritis, unspecified site: Secondary | ICD-10-CM | POA: Diagnosis not present

## 2016-09-21 DIAGNOSIS — J441 Chronic obstructive pulmonary disease with (acute) exacerbation: Secondary | ICD-10-CM | POA: Diagnosis not present

## 2016-09-21 DIAGNOSIS — E119 Type 2 diabetes mellitus without complications: Secondary | ICD-10-CM | POA: Diagnosis not present

## 2016-09-23 DIAGNOSIS — E119 Type 2 diabetes mellitus without complications: Secondary | ICD-10-CM | POA: Diagnosis not present

## 2016-09-23 DIAGNOSIS — I10 Essential (primary) hypertension: Secondary | ICD-10-CM | POA: Diagnosis not present

## 2016-09-23 DIAGNOSIS — R569 Unspecified convulsions: Secondary | ICD-10-CM | POA: Diagnosis not present

## 2016-09-23 DIAGNOSIS — Z7984 Long term (current) use of oral hypoglycemic drugs: Secondary | ICD-10-CM | POA: Diagnosis not present

## 2016-09-23 DIAGNOSIS — M109 Gout, unspecified: Secondary | ICD-10-CM | POA: Diagnosis not present

## 2016-09-23 DIAGNOSIS — J441 Chronic obstructive pulmonary disease with (acute) exacerbation: Secondary | ICD-10-CM | POA: Diagnosis not present

## 2016-09-23 DIAGNOSIS — D649 Anemia, unspecified: Secondary | ICD-10-CM | POA: Diagnosis not present

## 2016-09-23 DIAGNOSIS — M1991 Primary osteoarthritis, unspecified site: Secondary | ICD-10-CM | POA: Diagnosis not present

## 2016-09-23 DIAGNOSIS — Z8673 Personal history of transient ischemic attack (TIA), and cerebral infarction without residual deficits: Secondary | ICD-10-CM | POA: Diagnosis not present

## 2016-09-23 DIAGNOSIS — Z7951 Long term (current) use of inhaled steroids: Secondary | ICD-10-CM | POA: Diagnosis not present

## 2016-09-27 DIAGNOSIS — E119 Type 2 diabetes mellitus without complications: Secondary | ICD-10-CM | POA: Diagnosis not present

## 2016-09-27 DIAGNOSIS — H10413 Chronic giant papillary conjunctivitis, bilateral: Secondary | ICD-10-CM | POA: Diagnosis not present

## 2016-09-30 DIAGNOSIS — E782 Mixed hyperlipidemia: Secondary | ICD-10-CM | POA: Diagnosis not present

## 2016-09-30 DIAGNOSIS — R0902 Hypoxemia: Secondary | ICD-10-CM | POA: Diagnosis not present

## 2016-09-30 DIAGNOSIS — E1142 Type 2 diabetes mellitus with diabetic polyneuropathy: Secondary | ICD-10-CM | POA: Diagnosis not present

## 2016-09-30 DIAGNOSIS — J449 Chronic obstructive pulmonary disease, unspecified: Secondary | ICD-10-CM | POA: Diagnosis not present

## 2016-09-30 DIAGNOSIS — I10 Essential (primary) hypertension: Secondary | ICD-10-CM | POA: Diagnosis not present

## 2016-10-02 DIAGNOSIS — E785 Hyperlipidemia, unspecified: Secondary | ICD-10-CM

## 2016-10-02 DIAGNOSIS — Z8619 Personal history of other infectious and parasitic diseases: Secondary | ICD-10-CM | POA: Diagnosis not present

## 2016-10-02 DIAGNOSIS — J441 Chronic obstructive pulmonary disease with (acute) exacerbation: Secondary | ICD-10-CM | POA: Diagnosis not present

## 2016-10-02 DIAGNOSIS — E119 Type 2 diabetes mellitus without complications: Secondary | ICD-10-CM | POA: Diagnosis not present

## 2016-10-02 DIAGNOSIS — F418 Other specified anxiety disorders: Secondary | ICD-10-CM

## 2016-10-02 DIAGNOSIS — J449 Chronic obstructive pulmonary disease, unspecified: Secondary | ICD-10-CM | POA: Diagnosis not present

## 2016-10-02 DIAGNOSIS — Z888 Allergy status to other drugs, medicaments and biological substances status: Secondary | ICD-10-CM | POA: Diagnosis not present

## 2016-10-02 DIAGNOSIS — J9610 Chronic respiratory failure, unspecified whether with hypoxia or hypercapnia: Secondary | ICD-10-CM | POA: Diagnosis not present

## 2016-10-02 DIAGNOSIS — I10 Essential (primary) hypertension: Secondary | ICD-10-CM | POA: Diagnosis not present

## 2016-10-02 DIAGNOSIS — K219 Gastro-esophageal reflux disease without esophagitis: Secondary | ICD-10-CM | POA: Diagnosis not present

## 2016-10-02 DIAGNOSIS — R531 Weakness: Secondary | ICD-10-CM | POA: Diagnosis not present

## 2016-10-02 DIAGNOSIS — D509 Iron deficiency anemia, unspecified: Secondary | ICD-10-CM | POA: Diagnosis not present

## 2016-10-02 DIAGNOSIS — Z79899 Other long term (current) drug therapy: Secondary | ICD-10-CM | POA: Diagnosis not present

## 2016-10-02 DIAGNOSIS — I509 Heart failure, unspecified: Secondary | ICD-10-CM | POA: Diagnosis not present

## 2016-10-02 DIAGNOSIS — Z87891 Personal history of nicotine dependence: Secondary | ICD-10-CM | POA: Diagnosis not present

## 2016-10-02 DIAGNOSIS — Z885 Allergy status to narcotic agent status: Secondary | ICD-10-CM | POA: Diagnosis not present

## 2016-10-02 DIAGNOSIS — Z8673 Personal history of transient ischemic attack (TIA), and cerebral infarction without residual deficits: Secondary | ICD-10-CM | POA: Diagnosis not present

## 2016-10-02 DIAGNOSIS — M1990 Unspecified osteoarthritis, unspecified site: Secondary | ICD-10-CM | POA: Diagnosis not present

## 2016-10-02 DIAGNOSIS — J984 Other disorders of lung: Secondary | ICD-10-CM | POA: Diagnosis not present

## 2016-10-02 DIAGNOSIS — Z880 Allergy status to penicillin: Secondary | ICD-10-CM | POA: Diagnosis not present

## 2016-10-02 DIAGNOSIS — Z881 Allergy status to other antibiotic agents status: Secondary | ICD-10-CM | POA: Diagnosis not present

## 2016-10-02 DIAGNOSIS — D508 Other iron deficiency anemias: Secondary | ICD-10-CM | POA: Diagnosis not present

## 2016-10-02 DIAGNOSIS — F0390 Unspecified dementia without behavioral disturbance: Secondary | ICD-10-CM

## 2016-10-02 DIAGNOSIS — E7800 Pure hypercholesterolemia, unspecified: Secondary | ICD-10-CM | POA: Diagnosis not present

## 2016-10-07 DIAGNOSIS — D509 Iron deficiency anemia, unspecified: Secondary | ICD-10-CM | POA: Diagnosis not present

## 2016-10-07 DIAGNOSIS — K5521 Angiodysplasia of colon with hemorrhage: Secondary | ICD-10-CM | POA: Diagnosis not present

## 2016-10-08 DIAGNOSIS — Z7984 Long term (current) use of oral hypoglycemic drugs: Secondary | ICD-10-CM | POA: Diagnosis not present

## 2016-10-08 DIAGNOSIS — M1991 Primary osteoarthritis, unspecified site: Secondary | ICD-10-CM | POA: Diagnosis not present

## 2016-10-08 DIAGNOSIS — M109 Gout, unspecified: Secondary | ICD-10-CM | POA: Diagnosis not present

## 2016-10-08 DIAGNOSIS — R569 Unspecified convulsions: Secondary | ICD-10-CM | POA: Diagnosis not present

## 2016-10-08 DIAGNOSIS — E119 Type 2 diabetes mellitus without complications: Secondary | ICD-10-CM | POA: Diagnosis not present

## 2016-10-08 DIAGNOSIS — J441 Chronic obstructive pulmonary disease with (acute) exacerbation: Secondary | ICD-10-CM | POA: Diagnosis not present

## 2016-10-08 DIAGNOSIS — Z8673 Personal history of transient ischemic attack (TIA), and cerebral infarction without residual deficits: Secondary | ICD-10-CM | POA: Diagnosis not present

## 2016-10-08 DIAGNOSIS — D649 Anemia, unspecified: Secondary | ICD-10-CM | POA: Diagnosis not present

## 2016-10-08 DIAGNOSIS — Z7951 Long term (current) use of inhaled steroids: Secondary | ICD-10-CM | POA: Diagnosis not present

## 2016-10-08 DIAGNOSIS — I10 Essential (primary) hypertension: Secondary | ICD-10-CM | POA: Diagnosis not present

## 2016-10-11 DIAGNOSIS — M542 Cervicalgia: Secondary | ICD-10-CM | POA: Diagnosis not present

## 2016-10-11 DIAGNOSIS — K921 Melena: Secondary | ICD-10-CM | POA: Diagnosis not present

## 2016-10-20 DIAGNOSIS — K219 Gastro-esophageal reflux disease without esophagitis: Secondary | ICD-10-CM | POA: Diagnosis not present

## 2016-10-20 DIAGNOSIS — I10 Essential (primary) hypertension: Secondary | ICD-10-CM | POA: Diagnosis not present

## 2016-10-20 DIAGNOSIS — K224 Dyskinesia of esophagus: Secondary | ICD-10-CM | POA: Diagnosis not present

## 2016-10-20 DIAGNOSIS — T17208A Unspecified foreign body in pharynx causing other injury, initial encounter: Secondary | ICD-10-CM | POA: Diagnosis not present

## 2016-10-22 DIAGNOSIS — I10 Essential (primary) hypertension: Secondary | ICD-10-CM | POA: Diagnosis not present

## 2016-10-22 DIAGNOSIS — E1142 Type 2 diabetes mellitus with diabetic polyneuropathy: Secondary | ICD-10-CM | POA: Diagnosis not present

## 2016-10-22 DIAGNOSIS — J441 Chronic obstructive pulmonary disease with (acute) exacerbation: Secondary | ICD-10-CM | POA: Diagnosis not present

## 2016-10-22 DIAGNOSIS — D500 Iron deficiency anemia secondary to blood loss (chronic): Secondary | ICD-10-CM | POA: Diagnosis not present

## 2016-10-25 DIAGNOSIS — D649 Anemia, unspecified: Secondary | ICD-10-CM | POA: Diagnosis not present

## 2016-10-25 DIAGNOSIS — I10 Essential (primary) hypertension: Secondary | ICD-10-CM | POA: Diagnosis not present

## 2016-10-26 DIAGNOSIS — K922 Gastrointestinal hemorrhage, unspecified: Secondary | ICD-10-CM | POA: Diagnosis not present

## 2016-10-26 DIAGNOSIS — J9610 Chronic respiratory failure, unspecified whether with hypoxia or hypercapnia: Secondary | ICD-10-CM | POA: Diagnosis not present

## 2016-10-26 DIAGNOSIS — Z8719 Personal history of other diseases of the digestive system: Secondary | ICD-10-CM | POA: Diagnosis not present

## 2016-10-26 DIAGNOSIS — R0602 Shortness of breath: Secondary | ICD-10-CM | POA: Diagnosis not present

## 2016-10-26 DIAGNOSIS — M549 Dorsalgia, unspecified: Secondary | ICD-10-CM | POA: Diagnosis not present

## 2016-10-26 DIAGNOSIS — M542 Cervicalgia: Secondary | ICD-10-CM | POA: Diagnosis not present

## 2016-10-26 DIAGNOSIS — I10 Essential (primary) hypertension: Secondary | ICD-10-CM | POA: Diagnosis not present

## 2016-10-26 DIAGNOSIS — G8929 Other chronic pain: Secondary | ICD-10-CM | POA: Diagnosis not present

## 2016-10-26 DIAGNOSIS — Z8673 Personal history of transient ischemic attack (TIA), and cerebral infarction without residual deficits: Secondary | ICD-10-CM | POA: Diagnosis not present

## 2016-10-26 DIAGNOSIS — D500 Iron deficiency anemia secondary to blood loss (chronic): Secondary | ICD-10-CM | POA: Diagnosis not present

## 2016-10-26 DIAGNOSIS — J449 Chronic obstructive pulmonary disease, unspecified: Secondary | ICD-10-CM | POA: Diagnosis not present

## 2016-10-26 DIAGNOSIS — R51 Headache: Secondary | ICD-10-CM | POA: Diagnosis not present

## 2016-10-26 DIAGNOSIS — R531 Weakness: Secondary | ICD-10-CM | POA: Diagnosis not present

## 2016-10-26 DIAGNOSIS — Z87891 Personal history of nicotine dependence: Secondary | ICD-10-CM | POA: Diagnosis not present

## 2016-10-26 DIAGNOSIS — E119 Type 2 diabetes mellitus without complications: Secondary | ICD-10-CM | POA: Diagnosis not present

## 2016-10-27 DIAGNOSIS — D500 Iron deficiency anemia secondary to blood loss (chronic): Secondary | ICD-10-CM | POA: Diagnosis not present

## 2016-10-29 DIAGNOSIS — K7460 Unspecified cirrhosis of liver: Secondary | ICD-10-CM | POA: Diagnosis not present

## 2016-10-29 DIAGNOSIS — E538 Deficiency of other specified B group vitamins: Secondary | ICD-10-CM | POA: Diagnosis not present

## 2016-10-29 DIAGNOSIS — D731 Hypersplenism: Secondary | ICD-10-CM | POA: Diagnosis not present

## 2016-10-29 DIAGNOSIS — Q2733 Arteriovenous malformation of digestive system vessel: Secondary | ICD-10-CM | POA: Diagnosis not present

## 2016-10-29 DIAGNOSIS — D509 Iron deficiency anemia, unspecified: Secondary | ICD-10-CM | POA: Diagnosis not present

## 2016-10-29 DIAGNOSIS — K909 Intestinal malabsorption, unspecified: Secondary | ICD-10-CM | POA: Diagnosis not present

## 2016-11-05 DIAGNOSIS — D638 Anemia in other chronic diseases classified elsewhere: Secondary | ICD-10-CM | POA: Diagnosis not present

## 2016-11-05 DIAGNOSIS — D509 Iron deficiency anemia, unspecified: Secondary | ICD-10-CM | POA: Diagnosis not present

## 2016-11-12 DIAGNOSIS — D509 Iron deficiency anemia, unspecified: Secondary | ICD-10-CM | POA: Diagnosis not present

## 2016-11-24 DIAGNOSIS — Z0000 Encounter for general adult medical examination without abnormal findings: Secondary | ICD-10-CM | POA: Diagnosis not present

## 2016-11-24 DIAGNOSIS — Z23 Encounter for immunization: Secondary | ICD-10-CM | POA: Diagnosis not present

## 2016-11-24 DIAGNOSIS — N951 Menopausal and female climacteric states: Secondary | ICD-10-CM | POA: Diagnosis not present

## 2016-12-01 DIAGNOSIS — R0902 Hypoxemia: Secondary | ICD-10-CM | POA: Diagnosis not present

## 2016-12-01 DIAGNOSIS — J449 Chronic obstructive pulmonary disease, unspecified: Secondary | ICD-10-CM | POA: Diagnosis not present

## 2016-12-01 DIAGNOSIS — J441 Chronic obstructive pulmonary disease with (acute) exacerbation: Secondary | ICD-10-CM | POA: Diagnosis not present

## 2016-12-16 DIAGNOSIS — N959 Unspecified menopausal and perimenopausal disorder: Secondary | ICD-10-CM | POA: Diagnosis not present

## 2016-12-16 DIAGNOSIS — M810 Age-related osteoporosis without current pathological fracture: Secondary | ICD-10-CM | POA: Diagnosis not present

## 2016-12-17 DIAGNOSIS — D509 Iron deficiency anemia, unspecified: Secondary | ICD-10-CM | POA: Diagnosis not present

## 2016-12-22 DIAGNOSIS — R21 Rash and other nonspecific skin eruption: Secondary | ICD-10-CM | POA: Diagnosis not present

## 2016-12-22 DIAGNOSIS — L308 Other specified dermatitis: Secondary | ICD-10-CM | POA: Diagnosis not present

## 2017-01-01 DIAGNOSIS — J449 Chronic obstructive pulmonary disease, unspecified: Secondary | ICD-10-CM | POA: Diagnosis not present

## 2017-01-01 DIAGNOSIS — J441 Chronic obstructive pulmonary disease with (acute) exacerbation: Secondary | ICD-10-CM | POA: Diagnosis not present

## 2017-01-01 DIAGNOSIS — R0902 Hypoxemia: Secondary | ICD-10-CM | POA: Diagnosis not present

## 2017-01-12 DIAGNOSIS — J018 Other acute sinusitis: Secondary | ICD-10-CM | POA: Diagnosis not present

## 2017-01-12 DIAGNOSIS — I10 Essential (primary) hypertension: Secondary | ICD-10-CM | POA: Diagnosis not present

## 2017-01-12 DIAGNOSIS — C4489 Other specified malignant neoplasm of overlapping sites of skin: Secondary | ICD-10-CM | POA: Diagnosis not present

## 2017-01-12 DIAGNOSIS — J410 Simple chronic bronchitis: Secondary | ICD-10-CM | POA: Diagnosis not present

## 2017-01-17 DIAGNOSIS — D509 Iron deficiency anemia, unspecified: Secondary | ICD-10-CM | POA: Diagnosis not present

## 2017-01-21 DIAGNOSIS — L02413 Cutaneous abscess of right upper limb: Secondary | ICD-10-CM | POA: Diagnosis not present

## 2017-01-24 DIAGNOSIS — E1152 Type 2 diabetes mellitus with diabetic peripheral angiopathy with gangrene: Secondary | ICD-10-CM | POA: Diagnosis not present

## 2017-01-24 DIAGNOSIS — M79644 Pain in right finger(s): Secondary | ICD-10-CM | POA: Diagnosis not present

## 2017-01-27 DIAGNOSIS — E782 Mixed hyperlipidemia: Secondary | ICD-10-CM | POA: Diagnosis not present

## 2017-01-27 DIAGNOSIS — I10 Essential (primary) hypertension: Secondary | ICD-10-CM | POA: Diagnosis not present

## 2017-01-27 DIAGNOSIS — E1142 Type 2 diabetes mellitus with diabetic polyneuropathy: Secondary | ICD-10-CM | POA: Diagnosis not present

## 2017-01-27 DIAGNOSIS — R0902 Hypoxemia: Secondary | ICD-10-CM | POA: Diagnosis not present

## 2017-01-27 DIAGNOSIS — J410 Simple chronic bronchitis: Secondary | ICD-10-CM | POA: Diagnosis not present

## 2017-01-27 DIAGNOSIS — E1165 Type 2 diabetes mellitus with hyperglycemia: Secondary | ICD-10-CM | POA: Diagnosis not present

## 2017-02-01 ENCOUNTER — Other Ambulatory Visit (HOSPITAL_COMMUNITY): Payer: Self-pay | Admitting: Orthopedic Surgery

## 2017-02-01 ENCOUNTER — Ambulatory Visit (HOSPITAL_COMMUNITY)
Admission: RE | Admit: 2017-02-01 | Discharge: 2017-02-01 | Disposition: A | Discharge status: Home / Self Care | Payer: Medicare Other | Source: Ambulatory Visit | Attending: Vascular Surgery | Admitting: Vascular Surgery

## 2017-02-01 DIAGNOSIS — E1152 Type 2 diabetes mellitus with diabetic peripheral angiopathy with gangrene: Secondary | ICD-10-CM | POA: Diagnosis not present

## 2017-02-01 DIAGNOSIS — J449 Chronic obstructive pulmonary disease, unspecified: Secondary | ICD-10-CM | POA: Diagnosis not present

## 2017-02-01 DIAGNOSIS — J441 Chronic obstructive pulmonary disease with (acute) exacerbation: Secondary | ICD-10-CM | POA: Diagnosis not present

## 2017-02-01 DIAGNOSIS — R0902 Hypoxemia: Secondary | ICD-10-CM | POA: Diagnosis not present

## 2017-02-04 DIAGNOSIS — K219 Gastro-esophageal reflux disease without esophagitis: Secondary | ICD-10-CM | POA: Diagnosis not present

## 2017-02-04 DIAGNOSIS — Z8673 Personal history of transient ischemic attack (TIA), and cerebral infarction without residual deficits: Secondary | ICD-10-CM | POA: Diagnosis not present

## 2017-02-04 DIAGNOSIS — F418 Other specified anxiety disorders: Secondary | ICD-10-CM | POA: Diagnosis not present

## 2017-02-04 DIAGNOSIS — I10 Essential (primary) hypertension: Secondary | ICD-10-CM | POA: Diagnosis not present

## 2017-02-04 DIAGNOSIS — J441 Chronic obstructive pulmonary disease with (acute) exacerbation: Secondary | ICD-10-CM | POA: Diagnosis not present

## 2017-02-04 DIAGNOSIS — D649 Anemia, unspecified: Secondary | ICD-10-CM | POA: Diagnosis not present

## 2017-02-04 DIAGNOSIS — R05 Cough: Secondary | ICD-10-CM | POA: Diagnosis not present

## 2017-02-04 DIAGNOSIS — E7800 Pure hypercholesterolemia, unspecified: Secondary | ICD-10-CM | POA: Diagnosis not present

## 2017-02-04 DIAGNOSIS — D500 Iron deficiency anemia secondary to blood loss (chronic): Secondary | ICD-10-CM | POA: Diagnosis not present

## 2017-02-04 DIAGNOSIS — I110 Hypertensive heart disease with heart failure: Secondary | ICD-10-CM | POA: Diagnosis not present

## 2017-02-04 DIAGNOSIS — I672 Cerebral atherosclerosis: Secondary | ICD-10-CM | POA: Diagnosis not present

## 2017-02-04 DIAGNOSIS — R0603 Acute respiratory distress: Secondary | ICD-10-CM | POA: Diagnosis not present

## 2017-02-04 DIAGNOSIS — I509 Heart failure, unspecified: Secondary | ICD-10-CM | POA: Diagnosis not present

## 2017-02-04 DIAGNOSIS — R195 Other fecal abnormalities: Secondary | ICD-10-CM | POA: Diagnosis not present

## 2017-02-04 DIAGNOSIS — D509 Iron deficiency anemia, unspecified: Secondary | ICD-10-CM | POA: Diagnosis not present

## 2017-02-04 DIAGNOSIS — F0390 Unspecified dementia without behavioral disturbance: Secondary | ICD-10-CM | POA: Diagnosis not present

## 2017-02-04 DIAGNOSIS — R51 Headache: Secondary | ICD-10-CM | POA: Diagnosis not present

## 2017-02-04 DIAGNOSIS — Z8619 Personal history of other infectious and parasitic diseases: Secondary | ICD-10-CM | POA: Diagnosis not present

## 2017-02-05 DIAGNOSIS — D509 Iron deficiency anemia, unspecified: Secondary | ICD-10-CM | POA: Diagnosis not present

## 2017-02-05 DIAGNOSIS — R0602 Shortness of breath: Secondary | ICD-10-CM | POA: Diagnosis not present

## 2017-02-05 DIAGNOSIS — F418 Other specified anxiety disorders: Secondary | ICD-10-CM | POA: Diagnosis not present

## 2017-02-05 DIAGNOSIS — R51 Headache: Secondary | ICD-10-CM | POA: Diagnosis not present

## 2017-02-05 DIAGNOSIS — J441 Chronic obstructive pulmonary disease with (acute) exacerbation: Secondary | ICD-10-CM | POA: Diagnosis not present

## 2017-02-09 DIAGNOSIS — M79644 Pain in right finger(s): Secondary | ICD-10-CM | POA: Diagnosis not present

## 2017-02-09 DIAGNOSIS — E1152 Type 2 diabetes mellitus with diabetic peripheral angiopathy with gangrene: Secondary | ICD-10-CM | POA: Diagnosis not present

## 2017-02-14 ENCOUNTER — Other Ambulatory Visit (HOSPITAL_COMMUNITY): Payer: Self-pay | Admitting: Orthopedic Surgery

## 2017-02-14 DIAGNOSIS — E1152 Type 2 diabetes mellitus with diabetic peripheral angiopathy with gangrene: Secondary | ICD-10-CM

## 2017-02-16 DIAGNOSIS — J410 Simple chronic bronchitis: Secondary | ICD-10-CM | POA: Diagnosis not present

## 2017-02-16 DIAGNOSIS — I10 Essential (primary) hypertension: Secondary | ICD-10-CM | POA: Diagnosis not present

## 2017-02-16 DIAGNOSIS — D508 Other iron deficiency anemias: Secondary | ICD-10-CM | POA: Diagnosis not present

## 2017-02-18 ENCOUNTER — Other Ambulatory Visit: Payer: Self-pay | Admitting: Radiology

## 2017-02-21 ENCOUNTER — Other Ambulatory Visit (HOSPITAL_COMMUNITY): Payer: Self-pay | Admitting: Orthopedic Surgery

## 2017-02-21 ENCOUNTER — Ambulatory Visit (HOSPITAL_COMMUNITY): Admission: RE | Admit: 2017-02-21 | Payer: Medicare Other | Source: Ambulatory Visit

## 2017-02-21 ENCOUNTER — Encounter (HOSPITAL_COMMUNITY): Payer: Self-pay

## 2017-02-21 ENCOUNTER — Ambulatory Visit (HOSPITAL_COMMUNITY)
Admission: RE | Admit: 2017-02-21 | Discharge: 2017-02-21 | Disposition: A | Discharge status: Home / Self Care | Payer: Medicare Other | Source: Ambulatory Visit | Attending: Orthopedic Surgery | Admitting: Orthopedic Surgery

## 2017-02-21 DIAGNOSIS — Z8249 Family history of ischemic heart disease and other diseases of the circulatory system: Secondary | ICD-10-CM | POA: Insufficient documentation

## 2017-02-21 DIAGNOSIS — G8929 Other chronic pain: Secondary | ICD-10-CM | POA: Insufficient documentation

## 2017-02-21 DIAGNOSIS — G43009 Migraine without aura, not intractable, without status migrainosus: Secondary | ICD-10-CM | POA: Diagnosis not present

## 2017-02-21 DIAGNOSIS — M1990 Unspecified osteoarthritis, unspecified site: Secondary | ICD-10-CM | POA: Insufficient documentation

## 2017-02-21 DIAGNOSIS — E7800 Pure hypercholesterolemia, unspecified: Secondary | ICD-10-CM | POA: Insufficient documentation

## 2017-02-21 DIAGNOSIS — E1152 Type 2 diabetes mellitus with diabetic peripheral angiopathy with gangrene: Secondary | ICD-10-CM

## 2017-02-21 DIAGNOSIS — I7025 Atherosclerosis of native arteries of other extremities with ulceration: Secondary | ICD-10-CM | POA: Insufficient documentation

## 2017-02-21 DIAGNOSIS — F329 Major depressive disorder, single episode, unspecified: Secondary | ICD-10-CM | POA: Diagnosis not present

## 2017-02-21 DIAGNOSIS — I658 Occlusion and stenosis of other precerebral arteries: Secondary | ICD-10-CM | POA: Diagnosis not present

## 2017-02-21 DIAGNOSIS — Z7952 Long term (current) use of systemic steroids: Secondary | ICD-10-CM | POA: Insufficient documentation

## 2017-02-21 DIAGNOSIS — Z880 Allergy status to penicillin: Secondary | ICD-10-CM | POA: Insufficient documentation

## 2017-02-21 DIAGNOSIS — G44229 Chronic tension-type headache, not intractable: Secondary | ICD-10-CM | POA: Insufficient documentation

## 2017-02-21 DIAGNOSIS — M545 Low back pain: Secondary | ICD-10-CM | POA: Insufficient documentation

## 2017-02-21 DIAGNOSIS — F410 Panic disorder [episodic paroxysmal anxiety] without agoraphobia: Secondary | ICD-10-CM | POA: Insufficient documentation

## 2017-02-21 DIAGNOSIS — L98499 Non-pressure chronic ulcer of skin of other sites with unspecified severity: Secondary | ICD-10-CM | POA: Diagnosis not present

## 2017-02-21 DIAGNOSIS — Z885 Allergy status to narcotic agent status: Secondary | ICD-10-CM | POA: Diagnosis not present

## 2017-02-21 DIAGNOSIS — R569 Unspecified convulsions: Secondary | ICD-10-CM | POA: Insufficient documentation

## 2017-02-21 DIAGNOSIS — D649 Anemia, unspecified: Secondary | ICD-10-CM | POA: Insufficient documentation

## 2017-02-21 DIAGNOSIS — Z7951 Long term (current) use of inhaled steroids: Secondary | ICD-10-CM | POA: Diagnosis not present

## 2017-02-21 DIAGNOSIS — G250 Essential tremor: Secondary | ICD-10-CM | POA: Insufficient documentation

## 2017-02-21 DIAGNOSIS — K219 Gastro-esophageal reflux disease without esophagitis: Secondary | ICD-10-CM | POA: Insufficient documentation

## 2017-02-21 DIAGNOSIS — Z8673 Personal history of transient ischemic attack (TIA), and cerebral infarction without residual deficits: Secondary | ICD-10-CM | POA: Insufficient documentation

## 2017-02-21 DIAGNOSIS — Z7982 Long term (current) use of aspirin: Secondary | ICD-10-CM | POA: Diagnosis not present

## 2017-02-21 DIAGNOSIS — E1151 Type 2 diabetes mellitus with diabetic peripheral angiopathy without gangrene: Secondary | ICD-10-CM | POA: Insufficient documentation

## 2017-02-21 DIAGNOSIS — J449 Chronic obstructive pulmonary disease, unspecified: Secondary | ICD-10-CM | POA: Diagnosis not present

## 2017-02-21 DIAGNOSIS — Z87891 Personal history of nicotine dependence: Secondary | ICD-10-CM | POA: Diagnosis not present

## 2017-02-21 HISTORY — PX: IR GENERIC HISTORICAL: IMG1180011

## 2017-02-21 LAB — BASIC METABOLIC PANEL
Anion gap: 4 — ABNORMAL LOW (ref 5–15)
BUN: 20 mg/dL (ref 6–20)
CALCIUM: 9.3 mg/dL (ref 8.9–10.3)
CO2: 26 mmol/L (ref 22–32)
Chloride: 108 mmol/L (ref 101–111)
Creatinine, Ser: 1.05 mg/dL — ABNORMAL HIGH (ref 0.44–1.00)
GFR calc Af Amer: 59 mL/min — ABNORMAL LOW (ref >60)
GFR, EST NON AFRICAN AMERICAN: 51 mL/min — AB (ref >60)
Glucose, Bld: 136 mg/dL — ABNORMAL HIGH (ref 65–99)
POTASSIUM: 3.7 mmol/L (ref 3.5–5.1)
SODIUM: 138 mmol/L (ref 135–145)

## 2017-02-21 LAB — APTT: aPTT: 35 seconds (ref 24–36)

## 2017-02-21 LAB — CBC
HCT: 28.4 % — ABNORMAL LOW (ref 36.0–46.0)
HEMOGLOBIN: 8.6 g/dL — AB (ref 12.0–15.0)
MCH: 25.3 pg — AB (ref 26.0–34.0)
MCHC: 30.3 g/dL (ref 30.0–36.0)
MCV: 83.5 fL (ref 78.0–100.0)
Platelets: 190 10*3/uL (ref 150–400)
RBC: 3.4 MIL/uL — AB (ref 3.87–5.11)
RDW: 16.7 % — ABNORMAL HIGH (ref 11.5–15.5)
WBC: 5.3 10*3/uL (ref 4.0–10.5)

## 2017-02-21 LAB — PROTIME-INR
INR: 1.22
PROTHROMBIN TIME: 15.5 s — AB (ref 11.4–15.2)

## 2017-02-21 LAB — GLUCOSE, CAPILLARY: Glucose-Capillary: 119 mg/dL — ABNORMAL HIGH (ref 65–99)

## 2017-02-21 MED ORDER — FENTANYL CITRATE (PF) 100 MCG/2ML IJ SOLN
INTRAMUSCULAR | Status: AC | PRN
  Administered 2017-02-21: 25 ug via INTRAVENOUS
  Administered 2017-02-21: 50 ug via INTRAVENOUS
  Administered 2017-02-21: 25 ug via INTRAVENOUS

## 2017-02-21 MED ORDER — IODIXANOL 320 MG/ML IV SOLN
200.0000 mL | Freq: Once | INTRAVENOUS | Status: AC | PRN
  Administered 2017-02-21: 75 mL via INTRAVENOUS

## 2017-02-21 MED ORDER — SODIUM CHLORIDE 0.9 % IV SOLN: INTRAVENOUS | Status: DC

## 2017-02-21 MED ORDER — LIDOCAINE HCL 1 % IJ SOLN
INTRAMUSCULAR | Status: AC | PRN
  Administered 2017-02-21: 10 mL

## 2017-02-21 MED ORDER — LIDOCAINE HCL (PF) 1 % IJ SOLN
INTRAMUSCULAR | Status: AC
  Filled 2017-02-21: qty 10

## 2017-02-21 MED ORDER — MIDAZOLAM HCL 2 MG/2ML IJ SOLN
INTRAMUSCULAR | Status: AC
  Filled 2017-02-21: qty 6

## 2017-02-21 MED ORDER — HYDROCODONE-ACETAMINOPHEN 10-325 MG PO TABS
1.0000 | ORAL_TABLET | Freq: Once | ORAL | Status: AC | PRN
  Administered 2017-02-21: 1 via ORAL
  Filled 2017-02-21: qty 1

## 2017-02-21 MED ORDER — MIDAZOLAM HCL 2 MG/2ML IJ SOLN
INTRAMUSCULAR | Status: AC | PRN
  Administered 2017-02-21 (×5): 1 mg via INTRAVENOUS

## 2017-02-21 MED ORDER — FENTANYL CITRATE (PF) 100 MCG/2ML IJ SOLN
INTRAMUSCULAR | Status: AC
Start: 2017-02-21 — End: 2017-02-21
  Filled 2017-02-21: qty 4

## 2017-02-21 NOTE — Sedation Documentation (Signed)
5 Fr sheath removed from R femoral artery by Dr. Earleen Newport. Hemostasis achieved using Exoseal closure device. Groin level 0, RDP, RPT +Doppler.

## 2017-02-21 NOTE — Sedation Documentation (Signed)
Pt having trouble following instructions. Pressure drsg applied over gauze/tegaderm bandage. Groin level 0, DRP/DPT +Doppler.

## 2017-02-21 NOTE — Discharge Instructions (Signed)
NO METFORMIN/GLUCOPHAGE FOR 2 DAYS   Femoral Site Care Refer to this sheet in the next few weeks. These instructions provide you with information about caring for yourself after your procedure. Your health care provider may also give you more specific instructions. Your treatment has been planned according to current medical practices, but problems sometimes occur. Call your health care provider if you have any problems or questions after your procedure. What can I expect after the procedure? After your procedure, it is typical to have the following:  Bruising at the site that usually fades within 1-2 weeks.  Blood collecting in the tissue (hematoma) that may be painful to the touch. It should usually decrease in size and tenderness within 1-2 weeks. Follow these instructions at home:  Take medicines only as directed by your health care provider.  You may shower 24-48 hours after the procedure or as directed by your health care provider. Remove the bandage (dressing) and gently wash the site with plain soap and water. Pat the area dry with a clean towel. Do not rub the site, because this may cause bleeding.  Do not take baths, swim, or use a hot tub until your health care provider approves.  Check your insertion site every day for redness, swelling, or drainage.  Do not apply powder or lotion to the site.  Limit use of stairs to twice a day for the first 2-3 days or as directed by your health care provider.  Do not squat for the first 2-3 days or as directed by your health care provider.  Do not lift over 10 lb (4.5 kg) for 5 days after your procedure or as directed by your health care provider.  Ask your health care provider when it is okay to:  Return to work or school.  Resume usual physical activities or sports.  Resume sexual activity.  Do not drive home if you are discharged the same day as the procedure. Have someone else drive you.  You may drive 24 hours after the  procedure unless otherwise instructed by your health care provider.  Do not operate machinery or power tools for 24 hours after the procedure or as directed by your health care provider.  If your procedure was done as an outpatient procedure, which means that you went home the same day as your procedure, a responsible adult should be with you for the first 24 hours after you arrive home.  Keep all follow-up visits as directed by your health care provider. This is important. Contact a health care provider if:  You have a fever.  You have chills.  You have increased bleeding from the site. Hold pressure on the site. Get help right away if:  You have unusual pain at the site.  You have redness, warmth, or swelling at the site.  You have drainage (other than a small amount of blood on the dressing) from the site.  The site is bleeding, and the bleeding does not stop after 30 minutes of holding steady pressure on the site.  Your leg or foot becomes pale, cool, tingly, or numb. This information is not intended to replace advice given to you by your health care provider. Make sure you discuss any questions you have with your health care provider. Document Released: 08/09/2014 Document Revised: 05/13/2016 Document Reviewed: 06/25/2014 Elsevier Interactive Patient Education  2017 [employer]. Moderate Conscious Sedation, Adult, Care After These instructions provide you with information about caring for yourself after your procedure. Your health care  provider may also give you more specific instructions. Your treatment has been planned according to current medical practices, but problems sometimes occur. Call your health care provider if you have any problems or questions after your procedure. What can I expect after the procedure? After your procedure, it is common:  To feel sleepy for several hours.  To feel clumsy and have poor balance for several hours.  To have poor judgment for  several hours.  To vomit if you eat too soon. Follow these instructions at home: For at least 24 hours after the procedure:    Do not:  Participate in activities where you could fall or become injured.  Drive.  Use heavy machinery.  Drink alcohol.  Take sleeping pills or medicines that cause drowsiness.  Make important decisions or sign legal documents.  Take care of children on your own.  Rest. Eating and drinking   Follow the diet recommended by your health care provider.  If you vomit:  Drink water, juice, or soup when you can drink without vomiting.  Make sure you have little or no nausea before eating solid foods. General instructions   Have a responsible adult stay with you until you are awake and alert.  Take over-the-counter and prescription medicines only as told by your health care provider.  If you smoke, do not smoke without supervision.  Keep all follow-up visits as told by your health care provider. This is important. Contact a health care provider if:  You keep feeling nauseous or you keep vomiting.  You feel light-headed.  You develop a rash.  You have a fever. Get help right away if:  You have trouble breathing. This information is not intended to replace advice given to you by your health care provider. Make sure you discuss any questions you have with your health care provider. Document Released: 09/26/2013 Document Revised: 05/10/2016 Document Reviewed: 03/27/2016 Elsevier Interactive Patient Education  2017 [employer].

## 2017-02-21 NOTE — Sedation Documentation (Signed)
Gauze/tegaderm bandage applied to R fem art site. Level 0, RDP RPT + Doppler.

## 2017-02-21 NOTE — Procedures (Signed)
Interventional Radiology Procedure Note  Procedure:  US guided R CFA access, with arch angiogram.    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  Retrograde flow of the right vertebral artery is the dominant reconstitution of the RUE.   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   Common origin of the innominate/LCCA.   Patent left subclavian artery.   Deployment of Exoseal device.   Complications: None  Recommendations:  - 4 hours supine with right hip straight.   - Bandage at the right CFA access, pressure dressing if needed. - OK to advance diet. - Do not submerge for 7 days - Routine wound care - DC in 4.5 hours, once goals met - Encourage oral hydration - If metformin prescription, hold x 48 hours before restart    Signed,  Lindsay Juarez S. Earleen Newport, DO

## 2017-02-21 NOTE — H&P (Signed)
Chief Complaint: Patient was seen in consultation today for right hand arteriogram  Referring Physician(s):  Dr. Daryll Brod  Supervising Physician: Corrie Mckusick  Patient Status: [facility] - Out-pt  History of Present Illness: Lindsay Juarez is a 76 y.o. female with history of anemia, asthma, back pain, COPD, DDD, GERD, stroke, seizures, and diabetes presents with complain of hand pain.  Patient states she was checking her blood sugar recently and her finger prick site failed to heal.  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 She conrtinues work-up for her finger pain. Most recently, her digital brachial indices were found to be 0.28.  IR consulted for right arm and hand arteriogram at the request of Dr. Fredna Dow.  Case was reviewed by Dr. Annamaria Boots who feels she is appropriate for procedure.   Patient has been NPO.  She does not take blood thinners.  She has been in her usual state of health.  Past Medical History:  Diagnosis Date  . Anemia, unspecified   . Asthma   . Bradycardia   . Chronic low back pain   . Chronic tension headache   . Common migraine   . COPD (chronic obstructive pulmonary disease) ([place])   . Degenerative disc disease   . Depression   . Edema   . Familial tremor   . GERD (gastroesophageal reflux disease)   . Hypercholesteremia   . Impaired fasting glucose   . Memory loss   . Osteoarthritis   . Panic attack   . Plantar wart   . Rhabdomyolysis   . Seizures ([place])   . Stroke ([place])   . Toxic metabolic encephalopathy     Past Surgical History:  Procedure Laterality Date  . APPENDECTOMY     had as a child  . CHOLECYSTECTOMY  1970's  . left arm fx repair  2008  . right ankle fx repair  1980's  . right arm fx repair  2006  . right leg fx repair  2010   multiple fractures  . TONSILLECTOMY    . VAGINAL HYSTERECTOMY  1960's    Allergies: Fluoxetine; Avelox [moxifloxacin]; Cardizem [diltiazem hcl];  Doxepin; Fentanyl; Levaquin [levofloxacin in d5w]; Lithium carbonate [lithium]; Morphine and related; Nadolol; Penicillins; Prozac [fluoxetine hcl]; Theophylline; Vitamin k and related; and Xanax [alprazolam]  Medications: Prior to Admission medications   Medication Sig Start Date End Date Taking? Authorizing Provider  albuterol (PROVENTIL HFA;VENTOLIN HFA) 108 (90 BASE) MCG/ACT inhaler Inhale 2 puffs into the lungs as needed for wheezing or shortness of breath.    Yes Historical Provider, MD  aspirin 81 MG tablet Take 81 mg by mouth daily.   Yes Historical Provider, MD  atorvastatin (LIPITOR) 10 MG tablet Take 1 tablet by mouth daily.   Yes Historical Provider, MD  budesonide-formoterol (SYMBICORT) 160-4.5 MCG/ACT inhaler Inhale 2 puffs into the lungs 2 (two) times daily. 06/04/14  Yes Elsie Stain, MD  buPROPion (WELLBUTRIN XL) 150 MG 24 hr tablet Take 1 tablet by mouth 2 (two) times daily. 04/07/15  Yes Historical Provider, MD  citalopram (CELEXA) 40 MG tablet Take 1 tablet by mouth daily.   Yes Historical Provider, MD  ipratropium-albuterol (DUONEB) 0.5-2.5 (3) MG/3ML SOLN Take 3 mLs by nebulization as needed.   Yes Historical Provider, MD  latanoprost (XALATAN) 0.005 % ophthalmic solution Place into both eyes at bedtime.   Yes Historical Provider, MD  lisinopril (PRINIVIL,ZESTRIL) 20 MG tablet Take 20 mg by mouth daily.  Yes Historical Provider, MD  montelukast (SINGULAIR) 10 MG tablet Take 1 tablet (10 mg total) by mouth at bedtime. 11/05/14  Yes Elsie Stain, MD  potassium chloride SA (K-DUR,KLOR-CON) 20 MEQ tablet Take 1 tablet by mouth daily.   Yes Historical Provider, MD  predniSONE (DELTASONE) 10 MG tablet Take 4 for four days 3 for four days 2 for four days 1 for four days 08/05/15  Yes Elsie Stain, MD  spironolactone (ALDACTONE) 25 MG tablet Take 25 mg by mouth daily.   Yes Historical Provider, MD  dextromethorphan (DELSYM) 30 MG/5ML liquid Take 10 mLs (60 mg total) by mouth  as needed for cough. 05/27/15   Elsie Stain, MD  HYDROcodone-acetaminophen (NORCO) 10-325 MG per tablet Take 1 tablet by mouth 4 (four) times daily as needed.    Historical Provider, MD     Family History  Problem Relation Age of Onset  . Asthma Mother   . COPD Mother   . Hyperlipidemia Mother   . Heart attack Mother   . Lung cancer Mother   . Migraines Mother   . Osteoarthritis Mother   . Depression Mother   . Asthma Grandchild   . Hyperlipidemia Sister   . Hypertension Father   . Congestive Heart Failure Father   . Hypertension Maternal Aunt   . Melanoma      grandmother  . Migraines Grandchild   . Osteoarthritis Daughter   . Depression Daughter   . Depression Grandchild   . OCD Grandchild     Social History   Social History  . Marital status: Single    Spouse name: N/A  . Number of children: N/A  . Years of education: N/A   Social History Main Topics  . Smoking status: Former Smoker    Packs/day: 1.00    Years: 58.00    Types: Cigarettes    Start date: 12/21/1955    Quit date: 10/21/2014  . Smokeless tobacco: Never Used  . Alcohol use No  . Drug use: No  . Sexual activity: Not on file   Other Topics Concern  . Not on file   Social History Narrative  . No narrative on file     Review of Systems  Constitutional: Negative for fatigue and fever.  Respiratory: Negative for cough and shortness of breath.   Cardiovascular: Negative for chest pain.  Gastrointestinal: Negative for abdominal pain.  Psychiatric/Behavioral: Negative for behavioral problems and confusion.    Vital Signs: BP (!) 161/50   Pulse 85   Temp 98 F (36.7 C) (Oral)   Resp 20   Ht '4\' 10"'$  (1.473 m)   Wt 173 lb (78.5 kg)   SpO2 99%   BMI 36.16 kg/m   Physical Exam  Constitutional: She is oriented to person, place, and time. She appears well-developed.  Cardiovascular: Normal rate, regular rhythm and normal heart sounds.   Pulmonary/Chest: Effort normal and breath sounds  normal. No respiratory distress.  Neurological: She is alert and oriented to person, place, and time.  Skin: Skin is warm and dry.  Psychiatric: She has a normal mood and affect. Her behavior is normal. Judgment and thought content normal.  Nursing note and vitals reviewed.   Mallampati Score:  MD Evaluation Airway: WNL Heart: WNL Abdomen: WNL Chest/ Lungs: WNL ASA  Classification: 3 Mallampati/Airway Score: Two  Imaging: No results found.  Labs:  CBC:  Recent Labs  02/21/17 0635  WBC 5.3  HGB 8.6*  HCT 28.4*  PLT 190  COAGS:  Recent Labs  02/21/17 0635  INR 1.22  APTT 35    BMP:  Recent Labs  02/21/17 0635  NA 138  K 3.7  CL 108  CO2 26  GLUCOSE 136*  BUN 20  CALCIUM 9.3  CREATININE 1.05*  GFRNONAA 51*  GFRAA 59*    LIVER FUNCTION TESTS: No results for input(s): BILITOT, AST, ALT, ALKPHOS, PROT, ALBUMIN in the last 8760 hours.  TUMOR MARKERS: No results for input(s): AFPTM, CEA, CA199, CHROMGRNA in the last 8760 hours.  Assessment and Plan: Patient with history of gangrene of the right index finger presents today for arteriogram of the right arm and hand at the request of Dr. Fredna Dow.  Patient has been NPO. She does not take blood thinners.  She has been in her usual state of health.  Labs and medications reviewed.  Risks and benefits discussed with the patient including, but not limited to bleeding, infection, vascular injury or contrast induced renal failure. All of the patient's and daugther's questions were answered, patient and daughter are agreeable to proceed.  Daughter is HCPOA.  Consent signed and in chart.   Thank you for this interesting consult.  I greatly enjoyed meeting SHANESHA BEDNARZ and look forward to participating in their care.  A copy of this report was sent to the requesting provider on this date.  Electronically Signed: Docia Barrier 02/21/2017, 8:09 AM   I spent a total of  30 Minutes   in face to  face in clinical consultation, greater than 50% of which was counseling/coordinating care for right index finger gangrene.

## 2017-02-25 DIAGNOSIS — Z87891 Personal history of nicotine dependence: Secondary | ICD-10-CM | POA: Diagnosis not present

## 2017-02-25 DIAGNOSIS — Z7982 Long term (current) use of aspirin: Secondary | ICD-10-CM | POA: Diagnosis not present

## 2017-02-25 DIAGNOSIS — G8929 Other chronic pain: Secondary | ICD-10-CM | POA: Diagnosis not present

## 2017-02-25 DIAGNOSIS — R531 Weakness: Secondary | ICD-10-CM | POA: Diagnosis not present

## 2017-02-25 DIAGNOSIS — Z8673 Personal history of transient ischemic attack (TIA), and cerebral infarction without residual deficits: Secondary | ICD-10-CM | POA: Diagnosis not present

## 2017-02-25 DIAGNOSIS — I10 Essential (primary) hypertension: Secondary | ICD-10-CM | POA: Diagnosis not present

## 2017-02-25 DIAGNOSIS — D509 Iron deficiency anemia, unspecified: Secondary | ICD-10-CM | POA: Diagnosis not present

## 2017-02-25 DIAGNOSIS — E611 Iron deficiency: Secondary | ICD-10-CM | POA: Diagnosis not present

## 2017-02-25 DIAGNOSIS — R0602 Shortness of breath: Secondary | ICD-10-CM | POA: Diagnosis not present

## 2017-02-25 DIAGNOSIS — Z9181 History of falling: Secondary | ICD-10-CM | POA: Diagnosis not present

## 2017-02-25 DIAGNOSIS — R51 Headache: Secondary | ICD-10-CM | POA: Diagnosis not present

## 2017-02-25 DIAGNOSIS — Z79899 Other long term (current) drug therapy: Secondary | ICD-10-CM | POA: Diagnosis not present

## 2017-02-25 DIAGNOSIS — J449 Chronic obstructive pulmonary disease, unspecified: Secondary | ICD-10-CM | POA: Diagnosis not present

## 2017-02-25 DIAGNOSIS — E7800 Pure hypercholesterolemia, unspecified: Secondary | ICD-10-CM | POA: Diagnosis not present

## 2017-02-25 DIAGNOSIS — D649 Anemia, unspecified: Secondary | ICD-10-CM | POA: Diagnosis not present

## 2017-02-25 DIAGNOSIS — E119 Type 2 diabetes mellitus without complications: Secondary | ICD-10-CM | POA: Diagnosis not present

## 2017-02-25 DIAGNOSIS — K219 Gastro-esophageal reflux disease without esophagitis: Secondary | ICD-10-CM | POA: Diagnosis not present

## 2017-02-26 DIAGNOSIS — R601 Generalized edema: Secondary | ICD-10-CM | POA: Diagnosis not present

## 2017-02-26 DIAGNOSIS — D649 Anemia, unspecified: Secondary | ICD-10-CM | POA: Diagnosis not present

## 2017-02-26 DIAGNOSIS — D509 Iron deficiency anemia, unspecified: Secondary | ICD-10-CM | POA: Diagnosis not present

## 2017-02-26 DIAGNOSIS — J449 Chronic obstructive pulmonary disease, unspecified: Secondary | ICD-10-CM | POA: Diagnosis not present

## 2017-02-28 DIAGNOSIS — D649 Anemia, unspecified: Secondary | ICD-10-CM | POA: Diagnosis not present

## 2017-03-01 DIAGNOSIS — R0902 Hypoxemia: Secondary | ICD-10-CM | POA: Diagnosis not present

## 2017-03-01 DIAGNOSIS — D649 Anemia, unspecified: Secondary | ICD-10-CM | POA: Diagnosis not present

## 2017-03-01 DIAGNOSIS — J441 Chronic obstructive pulmonary disease with (acute) exacerbation: Secondary | ICD-10-CM | POA: Diagnosis not present

## 2017-03-01 DIAGNOSIS — J449 Chronic obstructive pulmonary disease, unspecified: Secondary | ICD-10-CM | POA: Diagnosis not present

## 2017-03-04 DIAGNOSIS — I771 Stricture of artery: Secondary | ICD-10-CM | POA: Diagnosis not present

## 2017-03-04 DIAGNOSIS — I96 Gangrene, not elsewhere classified: Secondary | ICD-10-CM | POA: Diagnosis not present

## 2017-03-04 DIAGNOSIS — E1152 Type 2 diabetes mellitus with diabetic peripheral angiopathy with gangrene: Secondary | ICD-10-CM | POA: Diagnosis not present

## 2017-03-04 DIAGNOSIS — M79644 Pain in right finger(s): Secondary | ICD-10-CM | POA: Diagnosis not present

## 2017-03-08 DIAGNOSIS — R05 Cough: Secondary | ICD-10-CM | POA: Diagnosis not present

## 2017-03-08 DIAGNOSIS — R0600 Dyspnea, unspecified: Secondary | ICD-10-CM | POA: Diagnosis not present

## 2017-03-09 DIAGNOSIS — D6489 Other specified anemias: Secondary | ICD-10-CM | POA: Diagnosis not present

## 2017-03-09 DIAGNOSIS — I70268 Atherosclerosis of native arteries of extremities with gangrene, other extremity: Secondary | ICD-10-CM | POA: Diagnosis not present

## 2017-03-09 DIAGNOSIS — J441 Chronic obstructive pulmonary disease with (acute) exacerbation: Secondary | ICD-10-CM | POA: Diagnosis not present

## 2017-03-11 DIAGNOSIS — I771 Stricture of artery: Secondary | ICD-10-CM | POA: Diagnosis not present

## 2017-03-14 ENCOUNTER — Encounter (HOSPITAL_BASED_OUTPATIENT_CLINIC_OR_DEPARTMENT_OTHER): Payer: Self-pay | Admitting: *Deleted

## 2017-03-14 ENCOUNTER — Other Ambulatory Visit: Payer: Self-pay | Admitting: Orthopedic Surgery

## 2017-03-14 NOTE — Progress Notes (Signed)
Pt denies any acute cardiopulmonary issues . Pt consented for daughter Lindsay Juarez to complete SDW-pre-op assessment. Daughter stated that pt had a recent EKG, CXR and labs done at [facility]. Daughter stated that pt had an echo, stress test and " probably a cardiac cath done years ago at [facility] " when she was under the care of Dr. Bettina Gavia, Cardiology; records were requested. Pt made aware to stop taking vitamins, fish oil and herbal medications. Do not take any NSAIDs ie: Ibuprofen, Advil, Naproxen, BC and Goody Powder or any medication containing Aspirin. Daughter made aware to not administer Metformin or Trulity to pt on morning of procedure. Daughter made aware to check pt BG every 2 hours prior to arrival to hospital , if BG is < 70 give 4 glucose tabs, wait 15 minutes and recheck BG, if BG remains < 70 call SS. Daughter verbalized understanding of all pre-op instructions.

## 2017-03-14 NOTE — Anesthesia Preprocedure Evaluation (Addendum)
Anesthesia Evaluation  Patient identified by MRN, date of birth, ID band Patient awake    Reviewed: Allergy & Precautions, NPO status , Patient's Chart, lab work & pertinent test results  Airway Mallampati: II  TM Distance: >3 FB Neck ROM: Full    Dental  (+) Edentulous Lower, Edentulous Upper   Pulmonary asthma , pneumonia, COPD, former smoker,    Pulmonary exam normal breath sounds clear to auscultation       Cardiovascular hypertension, Pt. on medications Normal cardiovascular exam Rhythm:Regular Rate:Normal     Neuro/Psych  Headaches, Seizures -,  PSYCHIATRIC DISORDERS Anxiety Depression CVA negative psych ROS   GI/Hepatic negative GI ROS, GERD  ,(+) Hepatitis -, C  Endo/Other  diabetes, Type 2, Oral Hypoglycemic Agents  Renal/GU negative Renal ROS     Musculoskeletal  (+) Arthritis ,   Abdominal (+) + obese,   Peds  Hematology  (+) anemia ,   Anesthesia Other Findings   Reproductive/Obstetrics negative OB ROS                           Anesthesia Physical Anesthesia Plan  ASA: III  Anesthesia Plan: Regional   Post-op Pain Management:    Induction:   Airway Management Planned:   Additional Equipment:   Intra-op Plan:   Post-operative Plan:   Informed Consent: I have reviewed the patients History and Physical, chart, labs and discussed the procedure including the risks, benefits and alternatives for the proposed anesthesia with the patient or authorized representative who has indicated his/her understanding and acceptance.   Dental advisory given  Plan Discussed with: CRNA  Anesthesia Plan Comments:         Anesthesia Quick Evaluation

## 2017-03-15 ENCOUNTER — Encounter (HOSPITAL_COMMUNITY)
Admission: RE | Disposition: A | Discharge status: Home / Self Care | Payer: Self-pay | Source: Ambulatory Visit | Attending: Orthopedic Surgery

## 2017-03-15 ENCOUNTER — Encounter (HOSPITAL_COMMUNITY): Payer: Self-pay | Admitting: *Deleted

## 2017-03-15 ENCOUNTER — Ambulatory Visit (HOSPITAL_COMMUNITY): Payer: Medicare Other | Admitting: Anesthesiology

## 2017-03-15 ENCOUNTER — Ambulatory Visit (HOSPITAL_BASED_OUTPATIENT_CLINIC_OR_DEPARTMENT_OTHER)
Admission: RE | Admit: 2017-03-15 | Discharge: 2017-03-15 | Disposition: A | Discharge status: Home / Self Care | Payer: Medicare Other | Source: Ambulatory Visit | Attending: Orthopedic Surgery | Admitting: Orthopedic Surgery

## 2017-03-15 DIAGNOSIS — E1152 Type 2 diabetes mellitus with diabetic peripheral angiopathy with gangrene: Secondary | ICD-10-CM | POA: Diagnosis not present

## 2017-03-15 DIAGNOSIS — Z79899 Other long term (current) drug therapy: Secondary | ICD-10-CM | POA: Diagnosis not present

## 2017-03-15 DIAGNOSIS — Z9049 Acquired absence of other specified parts of digestive tract: Secondary | ICD-10-CM | POA: Diagnosis not present

## 2017-03-15 DIAGNOSIS — Z9889 Other specified postprocedural states: Secondary | ICD-10-CM | POA: Insufficient documentation

## 2017-03-15 DIAGNOSIS — J449 Chronic obstructive pulmonary disease, unspecified: Secondary | ICD-10-CM | POA: Diagnosis not present

## 2017-03-15 DIAGNOSIS — Z7952 Long term (current) use of systemic steroids: Secondary | ICD-10-CM | POA: Diagnosis not present

## 2017-03-15 DIAGNOSIS — B1920 Unspecified viral hepatitis C without hepatic coma: Secondary | ICD-10-CM | POA: Diagnosis not present

## 2017-03-15 DIAGNOSIS — Z87891 Personal history of nicotine dependence: Secondary | ICD-10-CM | POA: Insufficient documentation

## 2017-03-15 DIAGNOSIS — M545 Low back pain: Secondary | ICD-10-CM | POA: Diagnosis not present

## 2017-03-15 DIAGNOSIS — I96 Gangrene, not elsewhere classified: Secondary | ICD-10-CM | POA: Diagnosis not present

## 2017-03-15 DIAGNOSIS — H409 Unspecified glaucoma: Secondary | ICD-10-CM | POA: Diagnosis not present

## 2017-03-15 DIAGNOSIS — Z885 Allergy status to narcotic agent status: Secondary | ICD-10-CM | POA: Insufficient documentation

## 2017-03-15 DIAGNOSIS — Z881 Allergy status to other antibiotic agents status: Secondary | ICD-10-CM | POA: Insufficient documentation

## 2017-03-15 DIAGNOSIS — F419 Anxiety disorder, unspecified: Secondary | ICD-10-CM | POA: Diagnosis not present

## 2017-03-15 DIAGNOSIS — Z880 Allergy status to penicillin: Secondary | ICD-10-CM | POA: Diagnosis not present

## 2017-03-15 DIAGNOSIS — Z7984 Long term (current) use of oral hypoglycemic drugs: Secondary | ICD-10-CM | POA: Insufficient documentation

## 2017-03-15 DIAGNOSIS — M1990 Unspecified osteoarthritis, unspecified site: Secondary | ICD-10-CM | POA: Diagnosis not present

## 2017-03-15 DIAGNOSIS — M810 Age-related osteoporosis without current pathological fracture: Secondary | ICD-10-CM | POA: Insufficient documentation

## 2017-03-15 DIAGNOSIS — Z8673 Personal history of transient ischemic attack (TIA), and cerebral infarction without residual deficits: Secondary | ICD-10-CM | POA: Insufficient documentation

## 2017-03-15 DIAGNOSIS — Z888 Allergy status to other drugs, medicaments and biological substances status: Secondary | ICD-10-CM | POA: Insufficient documentation

## 2017-03-15 DIAGNOSIS — K219 Gastro-esophageal reflux disease without esophagitis: Secondary | ICD-10-CM | POA: Diagnosis not present

## 2017-03-15 DIAGNOSIS — I70268 Atherosclerosis of native arteries of extremities with gangrene, other extremity: Secondary | ICD-10-CM | POA: Diagnosis not present

## 2017-03-15 DIAGNOSIS — I10 Essential (primary) hypertension: Secondary | ICD-10-CM | POA: Insufficient documentation

## 2017-03-15 DIAGNOSIS — Z90710 Acquired absence of both cervix and uterus: Secondary | ICD-10-CM | POA: Diagnosis not present

## 2017-03-15 DIAGNOSIS — D649 Anemia, unspecified: Secondary | ICD-10-CM | POA: Insufficient documentation

## 2017-03-15 DIAGNOSIS — Z8711 Personal history of peptic ulcer disease: Secondary | ICD-10-CM | POA: Insufficient documentation

## 2017-03-15 HISTORY — DX: Type 2 diabetes mellitus without complications: E11.9

## 2017-03-15 HISTORY — DX: Essential (primary) hypertension: I10

## 2017-03-15 HISTORY — DX: Unspecified glaucoma: H40.9

## 2017-03-15 HISTORY — DX: Unspecified cirrhosis of liver: K74.60

## 2017-03-15 HISTORY — DX: Pneumonia, unspecified organism: J18.9

## 2017-03-15 HISTORY — DX: Dependence on supplemental oxygen: Z99.81

## 2017-03-15 HISTORY — DX: Inflammatory liver disease, unspecified: K75.9

## 2017-03-15 HISTORY — PX: AMPUTATION: SHX166

## 2017-03-15 LAB — BASIC METABOLIC PANEL
ANION GAP: 10 (ref 5–15)
BUN: 33 mg/dL — ABNORMAL HIGH (ref 6–20)
CALCIUM: 8.9 mg/dL (ref 8.9–10.3)
CO2: 24 mmol/L (ref 22–32)
Chloride: 104 mmol/L (ref 101–111)
Creatinine, Ser: 1.07 mg/dL — ABNORMAL HIGH (ref 0.44–1.00)
GFR calc Af Amer: 58 mL/min — ABNORMAL LOW (ref >60)
GFR calc non Af Amer: 50 mL/min — ABNORMAL LOW (ref >60)
GLUCOSE: 165 mg/dL — AB (ref 65–99)
POTASSIUM: 4.4 mmol/L (ref 3.5–5.1)
Sodium: 138 mmol/L (ref 135–145)

## 2017-03-15 LAB — GLUCOSE, CAPILLARY
Glucose-Capillary: 156 mg/dL — ABNORMAL HIGH (ref 65–99)
Glucose-Capillary: 196 mg/dL — ABNORMAL HIGH (ref 65–99)

## 2017-03-15 LAB — CBC
HEMATOCRIT: 32.3 % — AB (ref 36.0–46.0)
HEMOGLOBIN: 9.9 g/dL — AB (ref 12.0–15.0)
MCH: 25.9 pg — ABNORMAL LOW (ref 26.0–34.0)
MCHC: 30.7 g/dL (ref 30.0–36.0)
MCV: 84.6 fL (ref 78.0–100.0)
Platelets: 186 10*3/uL (ref 150–400)
RBC: 3.82 MIL/uL — ABNORMAL LOW (ref 3.87–5.11)
RDW: 17.7 % — ABNORMAL HIGH (ref 11.5–15.5)
WBC: 7.4 10*3/uL (ref 4.0–10.5)

## 2017-03-15 SURGERY — AMPUTATION DIGIT
Anesthesia: Regional | Site: Finger | Laterality: Right

## 2017-03-15 MED ORDER — 0.9 % SODIUM CHLORIDE (POUR BTL) OPTIME
TOPICAL | Status: DC | PRN
  Administered 2017-03-15: 1000 mL

## 2017-03-15 MED ORDER — BUPIVACAINE-EPINEPHRINE (PF) 0.5% -1:200000 IJ SOLN
INTRAMUSCULAR | Status: DC | PRN
Start: 2017-03-15 — End: 2017-03-15
  Administered 2017-03-15: 20 mL via PERINEURAL

## 2017-03-15 MED ORDER — FENTANYL CITRATE (PF) 250 MCG/5ML IJ SOLN
INTRAMUSCULAR | Status: DC | PRN
  Administered 2017-03-15 (×2): 25 ug via INTRAVENOUS

## 2017-03-15 MED ORDER — MEPERIDINE HCL 25 MG/ML IJ SOLN: 6.2500 mg | INTRAMUSCULAR | Status: DC | PRN

## 2017-03-15 MED ORDER — LIDOCAINE 2% (20 MG/ML) 5 ML SYRINGE
INTRAMUSCULAR | Status: DC | PRN
  Administered 2017-03-15: 40 mg via INTRAVENOUS

## 2017-03-15 MED ORDER — FENTANYL CITRATE (PF) 100 MCG/2ML IJ SOLN
INTRAMUSCULAR | Status: AC
  Filled 2017-03-15: qty 2

## 2017-03-15 MED ORDER — LIDOCAINE HCL (PF) 1 % IJ SOLN
INTRAMUSCULAR | Status: AC
  Filled 2017-03-15: qty 30

## 2017-03-15 MED ORDER — HYDROCODONE-ACETAMINOPHEN 7.5-325 MG PO TABS: 1.0000 | ORAL_TABLET | Freq: Once | ORAL | Status: DC | PRN

## 2017-03-15 MED ORDER — CHLORHEXIDINE GLUCONATE 4 % EX LIQD: 60.0000 mL | Freq: Once | CUTANEOUS | Status: DC

## 2017-03-15 MED ORDER — LIDOCAINE-EPINEPHRINE (PF) 2 %-1:200000 IJ SOLN
INTRAMUSCULAR | Status: DC | PRN
Start: 2017-03-15 — End: 2017-03-15
  Administered 2017-03-15: 10 mL via PERINEURAL

## 2017-03-15 MED ORDER — PROMETHAZINE HCL 25 MG/ML IJ SOLN: 6.2500 mg | INTRAMUSCULAR | Status: DC | PRN

## 2017-03-15 MED ORDER — PROPOFOL 500 MG/50ML IV EMUL
INTRAVENOUS | Status: DC | PRN
  Administered 2017-03-15: 30 ug/kg/min via INTRAVENOUS

## 2017-03-15 MED ORDER — VANCOMYCIN HCL IN DEXTROSE 1-5 GM/200ML-% IV SOLN
1000.0000 mg | INTRAVENOUS | Status: AC
  Administered 2017-03-15: 1000 mg via INTRAVENOUS
  Filled 2017-03-15: qty 200

## 2017-03-15 MED ORDER — PROPOFOL 10 MG/ML IV BOLUS
INTRAVENOUS | Status: AC
  Filled 2017-03-15: qty 20

## 2017-03-15 MED ORDER — FENTANYL CITRATE (PF) 250 MCG/5ML IJ SOLN
INTRAMUSCULAR | Status: AC
  Filled 2017-03-15: qty 5

## 2017-03-15 MED ORDER — FENTANYL CITRATE (PF) 100 MCG/2ML IJ SOLN
50.0000 ug | Freq: Once | INTRAMUSCULAR | Status: AC
  Administered 2017-03-15: 50 ug via INTRAVENOUS

## 2017-03-15 MED ORDER — FENTANYL CITRATE (PF) 100 MCG/2ML IJ SOLN: 25.0000 ug | INTRAMUSCULAR | Status: DC | PRN

## 2017-03-15 MED ORDER — HYDROCODONE-ACETAMINOPHEN 5-325 MG PO TABS: 1.0000 | ORAL_TABLET | Freq: Four times a day (QID) | ORAL | 0 refills | Status: DC | PRN

## 2017-03-15 MED ORDER — LACTATED RINGERS IV SOLN
INTRAVENOUS | Status: DC
  Administered 2017-03-15: 08:00:00 via INTRAVENOUS

## 2017-03-15 MED ORDER — MIDAZOLAM HCL 2 MG/2ML IJ SOLN
INTRAMUSCULAR | Status: AC
  Filled 2017-03-15: qty 2

## 2017-03-15 SURGICAL SUPPLY — 43 items
BNDG COHESIVE 1X5 TAN STRL LF (GAUZE/BANDAGES/DRESSINGS) ×3 IMPLANT
BNDG COHESIVE 3X5 TAN STRL LF (GAUZE/BANDAGES/DRESSINGS) IMPLANT
BNDG ESMARK 4X9 LF (GAUZE/BANDAGES/DRESSINGS) ×3 IMPLANT
BNDG GAUZE ELAST 4 BULKY (GAUZE/BANDAGES/DRESSINGS) IMPLANT
CORDS BIPOLAR (ELECTRODE) ×3 IMPLANT
COVER SURGICAL LIGHT HANDLE (MISCELLANEOUS) ×3 IMPLANT
CUFF TOURNIQUET SINGLE 18IN (TOURNIQUET CUFF) ×3 IMPLANT
CUFF TOURNIQUET SINGLE 24IN (TOURNIQUET CUFF) IMPLANT
DRSG KUZMA FLUFF (GAUZE/BANDAGES/DRESSINGS) IMPLANT
DURAPREP 26ML APPLICATOR (WOUND CARE) ×3 IMPLANT
GAUZE SPONGE 4X4 12PLY STRL (GAUZE/BANDAGES/DRESSINGS) ×3 IMPLANT
GAUZE XEROFORM 1X8 LF (GAUZE/BANDAGES/DRESSINGS) ×3 IMPLANT
GLOVE BIO SURGEON STRL SZ 6.5 (GLOVE) ×2 IMPLANT
GLOVE BIO SURGEONS STRL SZ 6.5 (GLOVE) ×1
GLOVE BIOGEL PI IND STRL 8.5 (GLOVE) ×1 IMPLANT
GLOVE BIOGEL PI INDICATOR 8.5 (GLOVE) ×2
GLOVE SURG ORTHO 8.0 STRL STRW (GLOVE) ×3 IMPLANT
GOWN STRL REUS W/ TWL LRG LVL3 (GOWN DISPOSABLE) ×2 IMPLANT
GOWN STRL REUS W/ TWL XL LVL3 (GOWN DISPOSABLE) ×1 IMPLANT
GOWN STRL REUS W/TWL LRG LVL3 (GOWN DISPOSABLE) ×4
GOWN STRL REUS W/TWL XL LVL3 (GOWN DISPOSABLE) ×2
KIT BASIN OR (CUSTOM PROCEDURE TRAY) ×3 IMPLANT
KIT ROOM TURNOVER OR (KITS) ×3 IMPLANT
MANIFOLD NEPTUNE II (INSTRUMENTS) IMPLANT
NEEDLE HYPO 25GX1X1/2 BEV (NEEDLE) IMPLANT
NS IRRIG 1000ML POUR BTL (IV SOLUTION) ×3 IMPLANT
PACK ORTHO EXTREMITY (CUSTOM PROCEDURE TRAY) ×3 IMPLANT
PAD ARMBOARD 7.5X6 YLW CONV (MISCELLANEOUS) ×6 IMPLANT
PAD CAST 4YDX4 CTTN HI CHSV (CAST SUPPLIES) IMPLANT
PADDING CAST COTTON 4X4 STRL (CAST SUPPLIES)
RUBBERBAND STERILE (MISCELLANEOUS) ×3 IMPLANT
SPECIMEN JAR SMALL (MISCELLANEOUS) ×3 IMPLANT
SPONGE SCRUB IODOPHOR (GAUZE/BANDAGES/DRESSINGS) ×3 IMPLANT
SUT ETHILON 4 0 PS 2 18 (SUTURE) ×3 IMPLANT
SUT ETHILON 5 0 PS 2 18 (SUTURE) IMPLANT
SUT SILK 4 0 PS 2 (SUTURE) IMPLANT
SUT VIC AB 4-0 PS2 18 (SUTURE) ×3 IMPLANT
SUT VICRYL 4-0 PS2 18IN ABS (SUTURE) IMPLANT
SYR CONTROL 10ML LL (SYRINGE) IMPLANT
TOWEL OR 17X24 6PK STRL BLUE (TOWEL DISPOSABLE) IMPLANT
TOWEL OR 17X26 10 PK STRL BLUE (TOWEL DISPOSABLE) IMPLANT
UNDERPAD 30X30 (UNDERPADS AND DIAPERS) ×3 IMPLANT
WATER STERILE IRR 1000ML POUR (IV SOLUTION) ×3 IMPLANT

## 2017-03-15 NOTE — Discharge Instructions (Signed)

## 2017-03-15 NOTE — Anesthesia Procedure Notes (Addendum)
Anesthesia Regional Block: Axillary brachial plexus block   Pre-Anesthetic Checklist: ,, timeout performed, Correct Patient, Correct Site, Correct Laterality, Correct Procedure, Correct Position, site marked, Risks and benefits discussed,  Surgical consent,  Pre-op evaluation,  At surgeon's request and post-op pain management  Laterality: Right  Prep: chloraprep       Needles:  Injection technique: Single-shot  Needle Type: Stimiplex     Needle Length: 10cm  Needle Gauge: 21     Additional Needles:   Procedures: ultrasound guided,,,,,,,,  Motor weakness within 5 minutes.  Narrative:  Injection made incrementally with aspirations every 5 mL.  Performed by: Personally  Anesthesiologist: Nolon Nations  Additional Notes: Nerve located and needle positioned with direct ultrasound guidance. Good perineural spread, selective musculocutaneous, radial, and median nerve blocks. Patient tolerated well.

## 2017-03-15 NOTE — Brief Op Note (Signed)
03/15/2017  9:06 AM  PATIENT:  Lindsay Juarez  76 y.o. female  PRE-OPERATIVE DIAGNOSIS:  Gangrene Right Index Finger  POST-OPERATIVE DIAGNOSIS:  Gangrene Right Index Finger  PROCEDURE:  Procedure(s): AMPUTATION RIGHT INDEX FINGER (Right)  SURGEON:  Surgeon(s) and Role:    * Daryll Brod, MD - Primary  PHYSICIAN ASSISTANT:   ASSISTANTS: none   ANESTHESIA:   regional  EBL:  No intake/output data recorded.  BLOOD ADMINISTERED:none  DRAINS: none   LOCAL MEDICATIONS USED:   NONE  SPECIMEN:  Excision  DISPOSITION OF SPECIMEN:  PATHOLOGY  COUNTS:  YES  TOURNIQUET:   Total Tourniquet Time Documented: Forearm (Right) - 12 minutes Total: Forearm (Right) - 12 minutes   DICTATION: .Other Dictation: Dictation Number [id number]  PLAN OF CARE: Discharge to home after PACU  PATIENT DISPOSITION:  PACU - hemodynamically stable.

## 2017-03-15 NOTE — Op Note (Signed)
Dictation Number [id number]

## 2017-03-15 NOTE — Progress Notes (Signed)
Orthopedic Tech Progress Note Patient Details:  Lindsay Juarez 1941-04-07 [id number]  Patient ID: Lindsay Juarez, female   DOB: 1941/09/15, 76 y.o.   MRN: [id number]   Lindsay Juarez 03/15/2017, 9:49 AM Viewed order from RN order list

## 2017-03-15 NOTE — Anesthesia Postprocedure Evaluation (Signed)
Anesthesia Post Note  Patient: Lindsay Juarez  Procedure(s) Performed: Procedure(s) (LRB): AMPUTATION RIGHT INDEX FINGER (Right)  Patient location during evaluation: PACU Anesthesia Type: Regional Level of consciousness: sedated and patient cooperative Pain management: pain level controlled Vital Signs Assessment: post-procedure vital signs reviewed and stable Respiratory status: spontaneous breathing Cardiovascular status: stable Anesthetic complications: no       Last Vitals:  Vitals:   03/15/17 0926 03/15/17 0940  BP: (!) 135/54   Pulse: 86   Resp: 16   Temp:  36.7 C    Last Pain:  Vitals:   03/15/17 0940  TempSrc:   PainSc: 0-No pain                 Nolon Nations

## 2017-03-15 NOTE — Op Note (Signed)
NAME:  Lindsay Juarez, Lindsay Juarez              ACCOUNT NO.:  [id number]  MEDICAL RECORD NO.:  [id number]  LOCATION:                                 FACILITY:  PHYSICIAN:  Daryll Brod, M.D.            DATE OF BIRTH:  DATE OF PROCEDURE:  03/15/2017 DATE OF DISCHARGE:                              OPERATIVE REPORT   PREOPERATIVE DIAGNOSIS:  Gangrene, right index finger.  POSTOPERATIVE DIAGNOSIS:  Gangrene, right index finger.  OPERATION:  Amputation of right index finger, through the middle phalanx.  SURGEON:  Daryll Brod, MD.  ANESTHESIA:  Axillary block.  PLACE OF SURGERY:  [facility].  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  Arteriogram of the arm was unable to be done.  She has had considerable pain, is desirous of amputation to the finger.  Pre, peri, and postoperative course have been discussed along with risks and complications.  She is aware that there is no guarantee to the surgery; the possibility of infection; recurrence of injury to arteries, nerves, tendons; incomplete relief of symptoms and dystrophy.  In the preoperative area, the patient was seen, the extremity marked by both patient and surgeon, and antibiotic given.  PROCEDURE IN DETAIL:  The patient was brought to the operating room, where an axillary block had been carried out in the preoperative area under the Anesthesia Department.  She was prepped using DuraPrep in the supine position with the right arm free.  A 3-minute dry time was allowed, and a time-out taken, confirming the patient and procedure. The limb was exsanguinated with an Esmarch bandage.  Tourniquet placed on the upper arm was inflated to 250 mmHg.  An incision was then made for elevation of a volar flap of the distal phalanx pulp.  This was carried down through subcutaneous tissue.  Bleeders  were electrocauterized as necessary with bipolar.  The flexor tendon was cut along with the extensor tendon at the distal interphalangeal joint.  The condyles of the middle phalanx were then removed with a rongeur to smooth the entire surface area.  The specimen was sent to Pathology. The wound was copiously irrigated with saline.  The 4-0 Vicryl was used to suture the distal portion of the neurovascular bundles.  The skin was then closed with interrupted 4-0 nylon sutures.  A sterile compressive dressing and splint to the finger was applied.  On deflation of the tourniquet, remaining fingers pinked.  She was taken to the recovery room for observation in satisfactory condition.  She will be discharged to home to return to the [facility] in 1 week, on Norco.  This will be 5/325, #20, no refills.          ______________________________ Daryll Brod, M.D.     GK/MEDQ  D:  03/15/2017  T:  03/15/2017  Job:  [id number]

## 2017-03-15 NOTE — Transfer of Care (Signed)
Immediate Anesthesia Transfer of Care Note  Patient: Lindsay Juarez  Procedure(s) Performed: Procedure(s): AMPUTATION RIGHT INDEX FINGER (Right)  Patient Location: PACU  Anesthesia Type:MAC combined with regional for post-op pain  Level of Consciousness: awake, alert  and oriented  Airway & Oxygen Therapy: Patient Spontanous Breathing and Patient connected to nasal cannula oxygen  Post-op Assessment: Report given to RN and Post -op Vital signs reviewed and stable  Post vital signs: Reviewed and stable  Last Vitals:  Vitals:   03/15/17 0815 03/15/17 0910  BP: (!) 126/30   Pulse: 87   Resp: 17   Temp:  (P) 36.9 C    Last Pain:  Vitals:   03/15/17 0721  TempSrc:   PainSc: 6       Patients Stated Pain Goal: 7 (89/37/34 2876)  Complications: No apparent anesthesia complications

## 2017-03-15 NOTE — Progress Notes (Signed)
Orthopedic Tech Progress Note Patient Details:  Lindsay Juarez 22-Mar-1941 [id number]  Ortho Devices Type of Ortho Device: Arm sling Ortho Device/Splint Location: rue Ortho Device/Splint Interventions: Application   Hildred Priest 03/15/2017, 9:49 AM

## 2017-03-15 NOTE — H&P (Addendum)
Lindsay Juarez is an 76 y.o. female.   Chief Complaint: gangrene right index finger HPI: Lindsay Juarez is a 76 year old right-hand-dominant female referred by Dr. Tobie Poet for consultation regarding growth on her right index finger radial side of the tip. This been present for 2 months. She states this began after a skin tear trying to draw blood. She is diabetic. She is complaining of increasing burning throbbing pain in the area with coldness to the finger. States that the area has enlarged. She has a history of diabetes arthritis no history of thyroid problems or gout. Family history is negative for these. Her arthritis appears to be osteoarthritis to their knowledge. Nothing seems to make it better or worse. She is complaining of decreased sensation in the finger.Digital brachial indices have been done and read out by Dr. Sherren Mocha early. This reveals a significant change with monophasic or dampened Doppler on her right hand down to her fingers. Her digit brachial indices is 0.28. She was refered to [facility] and saw Dr. Truman Hayward. He is scheduled for surgery. She states she cannot wait to have that done at that time. She would like to have treatment to the index finger before that time because it is very painful for her. She is uncertain as to whether a appointment was made with vascular surgery for her innominate artery thrombosis. Is obviously continuing to throw micro emboli.             Past Medical History:  Diagnosis Date  . Anemia, unspecified   . Asthma   . Bradycardia   . Chronic low back pain   . Chronic tension headache   . Cirrhosis (Lindsay Juarez)   . Common migraine   . COPD (chronic obstructive pulmonary disease) ([place])   . Degenerative disc disease   . Depression   . Diabetes mellitus without complication ([place])   . Edema   . Familial tremor   . GERD (gastroesophageal reflux disease)   . Glaucoma   . Hepatitis    Hep C in the past; had treatment  . Hypercholesteremia   .  Hypertension   . Impaired fasting glucose   . Memory loss   . Osteoarthritis   . Oxygen dependent   . Panic attack   . Plantar wart   . Pneumonia   . Rhabdomyolysis   . Seizures ([place])   . Stroke ([place])   . Toxic metabolic encephalopathy     Past Surgical History:  Procedure Laterality Date  . APPENDECTOMY     had as a child  . CHOLECYSTECTOMY  1970's  . IR GENERIC HISTORICAL  02/21/2017   IR ARCH CERVICICEBRAL NON-SEL (MS) 02/21/2017 Corrie Mckusick, DO [facility]-INTERV RAD  . IR GENERIC HISTORICAL  02/21/2017   IR US GUIDE VASC ACCESS RIGHT 02/21/2017 Corrie Mckusick, DO [facility]-INTER[facility]  . left arm fx repair  2008  . right ankle fx repair  1980's  . right arm fx repair  2006  . right leg fx repair  2010   multiple fractures  . TONSILLECTOMY    . VAGINAL HYSTERECTOMY  1960's    Family History  Problem Relation Age of Onset  . Asthma Mother   . COPD Mother   . Hyperlipidemia Mother   . Heart attack Mother   . Lung cancer Mother   . Migraines Mother   . Osteoarthritis Mother   . Depression Mother   . Asthma Grandchild   . Hyperlipidemia Sister   . Hypertension Father   . Congestive  Heart Failure Father   . Hypertension Maternal Aunt   . Melanoma      grandmother  . Migraines Grandchild   . Osteoarthritis Daughter   . Depression Daughter   . Depression Grandchild   . OCD Grandchild    Social History:  reports that she quit smoking about 2 years ago. Her smoking use included Cigarettes. She started smoking about 61 years ago. She has a 58.00 pack-year smoking history. She has never used smokeless tobacco. She reports that she does not drink alcohol or use drugs.  Allergies:  Allergies  Allergen Reactions  . Carbamazepine Other (See Comments)    Bleeding Ears  . Levofloxacin Hives, Itching, Swelling and Rash    SWELLING REACTION UNSPECIFIED   . Menadione     Other reaction(s): Other (See Comments) Takes skin pigmentation off  . Morphine Nausea And Vomiting    Other  reaction(s): Confusion  . Other Nausea And Vomiting and Rash    Avalox   . Penicillins Rash    Rash, hives, itching  . Theophylline Hives and Nausea Only  . Avelox [Moxifloxacin] Nausea And Vomiting    Upset stomach  . Cardizem [Diltiazem Hcl] Rash and Other (See Comments)    Muscle spasm   . Cefuroxime Axetil Nausea And Vomiting  . Doxepin Rash and Other (See Comments)    Drawing up sensation  . Fluoxetine Other (See Comments)    "makes me mean"  . Gatifloxacin Rash  . Lithium Carbonate [Lithium] Itching and Rash  . Morphine And Related Other (See Comments)    Confusion  . Moxifloxacin Hcl In Nacl Nausea Only    Upset stomach  . Nadolol Rash and Other (See Comments)    BRADYCARDIA  . Oxycodone Itching and Rash  . Prozac [Fluoxetine Hcl] Other (See Comments)    "makes me mean."  . Vitamin K And Related Other (See Comments)    Vitamin k shots - takes skin pigmentation off arm  . Xanax [Alprazolam] Other (See Comments)    tiredness    No prescriptions prior to admission.    No results found for this or any previous visit (from the past 48 hour(s)).  No results found.   Pertinent items are noted in HPI.  Height '4\' 10"'$  (1.473 m), weight 78.5 kg (173 lb).  General appearance: alert, cooperative and appears stated age Head: Normocephalic, without obvious abnormality Neck: no JVD Resp: clear to auscultation bilaterally Cardio: regular rate and rhythm, S1, S2 normal, no murmur, click, rub or gallop GI: soft, non-tender; bowel sounds normal; no masses,  no organomegaly Extremities: pain and gangrene right index finger Pulses: 2+ and symmetric Skin: Skin color, texture, turgor normal. No rashes or lesions Neurologic: Grossly normal Incision/Wound: na  Assessment/Plan Assessment:  1. Innominate artery stenosis  2. Gangrene right index finger   Plan: We will schedule her for application of her index finger. There were advised that this may not heal. That it may  require amputation all the way back to the metacarpal phalangeal joint to gain healing. Unable to do an arteriogram distal to the area stenosis. Questions are encouraged and answered to their satisfaction she is scheduled for a imitation index finger as an outpatient under regional anesthesia. Advised that there is potential without treatment to the proximal lesion that the emboli will continue to be thrown with progressive finger loss.       Kasidi Shanker R 03/15/2017, 5:21 AM

## 2017-03-16 ENCOUNTER — Encounter (HOSPITAL_COMMUNITY): Payer: Self-pay | Admitting: Orthopedic Surgery

## 2017-03-28 DIAGNOSIS — K5521 Angiodysplasia of colon with hemorrhage: Secondary | ICD-10-CM | POA: Diagnosis not present

## 2017-03-28 DIAGNOSIS — D638 Anemia in other chronic diseases classified elsewhere: Secondary | ICD-10-CM | POA: Diagnosis not present

## 2017-03-28 DIAGNOSIS — D649 Anemia, unspecified: Secondary | ICD-10-CM | POA: Diagnosis not present

## 2017-03-28 DIAGNOSIS — Q2733 Arteriovenous malformation of digestive system vessel: Secondary | ICD-10-CM | POA: Diagnosis not present

## 2017-03-28 DIAGNOSIS — E611 Iron deficiency: Secondary | ICD-10-CM | POA: Diagnosis not present

## 2017-03-28 DIAGNOSIS — D500 Iron deficiency anemia secondary to blood loss (chronic): Secondary | ICD-10-CM | POA: Diagnosis not present

## 2017-03-29 DIAGNOSIS — G301 Alzheimer's disease with late onset: Secondary | ICD-10-CM | POA: Diagnosis not present

## 2017-03-29 DIAGNOSIS — D638 Anemia in other chronic diseases classified elsewhere: Secondary | ICD-10-CM | POA: Diagnosis not present

## 2017-03-29 DIAGNOSIS — R21 Rash and other nonspecific skin eruption: Secondary | ICD-10-CM | POA: Diagnosis not present

## 2017-03-29 DIAGNOSIS — J410 Simple chronic bronchitis: Secondary | ICD-10-CM | POA: Diagnosis not present

## 2017-03-29 DIAGNOSIS — Z89021 Acquired absence of right finger(s): Secondary | ICD-10-CM | POA: Diagnosis not present

## 2017-03-30 DIAGNOSIS — I96 Gangrene, not elsewhere classified: Secondary | ICD-10-CM | POA: Diagnosis not present

## 2017-03-30 DIAGNOSIS — M79644 Pain in right finger(s): Secondary | ICD-10-CM | POA: Diagnosis not present

## 2017-03-31 DIAGNOSIS — I96 Gangrene, not elsewhere classified: Secondary | ICD-10-CM | POA: Diagnosis not present

## 2017-03-31 DIAGNOSIS — M79644 Pain in right finger(s): Secondary | ICD-10-CM | POA: Diagnosis not present

## 2017-04-01 DIAGNOSIS — J449 Chronic obstructive pulmonary disease, unspecified: Secondary | ICD-10-CM | POA: Diagnosis not present

## 2017-04-01 DIAGNOSIS — J441 Chronic obstructive pulmonary disease with (acute) exacerbation: Secondary | ICD-10-CM | POA: Diagnosis not present

## 2017-04-01 DIAGNOSIS — R0902 Hypoxemia: Secondary | ICD-10-CM | POA: Diagnosis not present

## 2017-04-12 ENCOUNTER — Encounter: Payer: Self-pay | Admitting: Vascular Surgery

## 2017-04-14 DIAGNOSIS — D509 Iron deficiency anemia, unspecified: Secondary | ICD-10-CM | POA: Diagnosis not present

## 2017-04-19 ENCOUNTER — Encounter: Payer: Self-pay | Admitting: Vascular Surgery

## 2017-04-19 ENCOUNTER — Ambulatory Visit (INDEPENDENT_AMBULATORY_CARE_PROVIDER_SITE_OTHER): Payer: Medicare Other | Admitting: Vascular Surgery

## 2017-04-19 VITALS — BP 119/58 | HR 90 | Temp 98.0°F | Resp 20 | Ht 59.0 in | Wt 167.0 lb

## 2017-04-19 DIAGNOSIS — I708 Atherosclerosis of other arteries: Secondary | ICD-10-CM

## 2017-04-19 NOTE — Progress Notes (Signed)
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 She is here today with her daughter. She had difficulty with wound on the tip of her right second finger that there continued to be nonhealing. She underwent amputation of this with Dr.Kuzma approximately one month ago. She underwent noninvasive vascular studies suggesting diminished flow in her right arm versus her left arm on a day since on 02/01/2017 revealed monophasic flows with dampened flow in her digits. Had a stent 50% pressure in her right arm versus her left arm pressure. She continues to have some pain and slow healing and her index finger. There was some suggestion of potential embolus to the thumb and third finger but this is completely resolved. He does have a long history of multiple medical problems listed above. She does have history of bradycardia. She does have memory loss and she has a hard time answering many questions and her daughter asked to correct her. Denies any pain in her right arm associated with activity. She does have a history of MR suggesting old strokes but does not recall a specific episode of unilateral weakness.  Past Medical History:  Diagnosis Date  . Anemia, unspecified   . Asthma   . Bradycardia   . Chronic low back pain   . Chronic tension headache   . Cirrhosis ([place])   . Common migraine   . COPD (chronic obstructive pulmonary disease) ([place])   . Degenerative disc disease   . Depression   . Diabetes mellitus without complication ([place])   . Edema   . Familial tremor   . GERD (gastroesophageal reflux disease)   . Glaucoma   . Hepatitis    Hep C in the past; had treatment  . Hypercholesteremia   . Hypertension   . Impaired  fasting glucose   . Memory loss   . Osteoarthritis   . Oxygen dependent   . Panic attack   . Plantar wart   . Pneumonia   . Rhabdomyolysis   . Seizures ([place])   . Stroke ([place])   . Toxic metabolic encephalopathy     Family History  Problem Relation Age of Onset  . Asthma Mother   . COPD Mother   . Hyperlipidemia Mother   . Heart attack Mother   . Lung cancer Mother   . Migraines Mother   . Osteoarthritis Mother   . Depression Mother   . Asthma Grandchild   . Hyperlipidemia Sister   . Hypertension Father   . Congestive Heart Failure Father   . Hypertension Maternal Aunt   . Melanoma      grandmother  . Migraines Grandchild   . Osteoarthritis Daughter   . Depression Daughter   . Depression Grandchild   . OCD Grandchild     SOCIAL HISTORY: Social History   Social History  . Marital status: Single    Spouse name: N/A  . Number of children: N/A  . Years of education: N/A   Occupational History  . Not on file.   Social History Main Topics  . Smoking status: Former Smoker    Packs/day: 1.00    Years: 58.00    Types: Cigarettes    Start date: 12/21/1955    Quit date: 10/21/2014  . Smokeless tobacco: Never Used  .  Alcohol use No  . Drug use: No  . Sexual activity: Not on file   Other Topics Concern  . Not on file   Social History Narrative  . No narrative on file    Allergies  Allergen Reactions  . Carbamazepine Other (See Comments)    Bleeding Ears  . Levofloxacin Hives, Itching, Swelling and Rash    SWELLING REACTION UNSPECIFIED   . Menadione     Other reaction(s): Other (See Comments) Takes skin pigmentation off  . Morphine Nausea And Vomiting    Other reaction(s): Confusion  . Other Nausea And Vomiting and Rash    Avalox   . Penicillins Rash    Rash, hives, itching  . Theophylline Hives and Nausea Only  . Avelox [Moxifloxacin] Nausea And Vomiting    Upset stomach  . Cardizem [Diltiazem Hcl] Rash and Other (See Comments)    Muscle spasm     . Cefuroxime Axetil Nausea And Vomiting  . Doxepin Rash and Other (See Comments)    Drawing up sensation  . Fluoxetine Other (See Comments)    "makes me mean"  . Gatifloxacin Rash  . Lithium Carbonate [Lithium] Itching and Rash  . Morphine And Related Other (See Comments)    Confusion  . Moxifloxacin Hcl In Nacl Nausea Only    Upset stomach  . Nadolol Rash and Other (See Comments)    BRADYCARDIA  . Oxycodone Itching and Rash  . Prozac [Fluoxetine Hcl] Other (See Comments)    "makes me mean."  . Vitamin K And Related Other (See Comments)    Vitamin k shots - takes skin pigmentation off arm  . Xanax [Alprazolam] Other (See Comments)    tiredness    Current Outpatient Prescriptions  Medication Sig Dispense Refill  . albuterol (PROVENTIL HFA;VENTOLIN HFA) 108 (90 BASE) MCG/ACT inhaler Inhale 2 puffs into the lungs as needed for wheezing or shortness of breath.     Marland Kitchen aspirin 81 MG tablet Take 81 mg by mouth daily.    Marland Kitchen atorvastatin (LIPITOR) 10 MG tablet Take 10 mg by mouth daily.     . budesonide-formoterol (SYMBICORT) 160-4.5 MCG/ACT inhaler Inhale 2 puffs into the lungs 2 (two) times daily. 1 Inhaler 12  . citalopram (CELEXA) 40 MG tablet Take 40 mg by mouth daily.     . Dulaglutide (TRULICITY [place]) Inject 1.19 mg into the skin once a week. Wed    . ipratropium-albuterol (DUONEB) 0.5-2.5 (3) MG/3ML SOLN Take 3 mLs by nebulization as needed (shortness of breath).     . latanoprost (XALATAN) 0.005 % ophthalmic solution Place 1 drop into both eyes at bedtime.     Marland Kitchen lisinopril (PRINIVIL,ZESTRIL) 20 MG tablet Take 20 mg by mouth daily.    . montelukast (SINGULAIR) 10 MG tablet Take 1 tablet (10 mg total) by mouth at bedtime. 30 tablet 11  . OXYGEN Inhale 2 L into the lungs. 24 hrs day    . potassium chloride SA (K-DUR,KLOR-CON) 20 MEQ tablet Take 20 mEq by mouth daily.     Marland Kitchen tiotropium (SPIRIVA) 18 MCG inhalation capsule Place 18 mcg into inhaler and inhale daily.     No current  facility-administered medications for this visit.     REVIEW OF SYSTEMS:  '[X]'$  denotes positive finding, '[ ]'$  denotes negative finding Cardiac  Comments:  Chest pain or chest pressure: x   Shortness of breath upon exertion: x   Short of breath when lying flat: x   Irregular heart rhythm:  Vascular    Pain in calf, thigh, or hip brought on by ambulation:    Pain in feet at night that wakes you up from your sleep:  x   Blood clot in your veins:    Leg swelling:  x       Pulmonary    Oxygen at home: x   Productive cough:     Wheezing:  x       Neurologic    Sudden weakness in arms or legs:     Sudden numbness in arms or legs:  x   Sudden onset of difficulty speaking or slurred speech: x   Temporary loss of vision in one eye:     Problems with dizziness:         Gastrointestinal    Blood in stool:  x   Vomited blood:         Genitourinary    Burning when urinating:     Blood in urine:        Psychiatric    Major depression:         Hematologic    Bleeding problems: x Requires frequent blood transfusions with chronic anemia x  Problems with blood clotting too easily:        Skin    Rashes or ulcers:        Constitutional    Fever or chills:      PHYSICAL EXAM: Vitals:   04/19/17 1026  BP: (!) 119/58  Pulse: 90  Resp: 20  Temp: 98 F (36.7 C)  TempSrc: Oral  SpO2: 98%  Weight: 167 lb (75.8 kg)  Height: '4\' 11"'$  (1.499 m)    GENERAL: The patient is a well-nourished female, in no acute distress. The vital signs are documented above. CARDIOVASCULAR: 2+ left radial pulse and absent right brachial and radial pulse PULMONARY: There is good air exchange  ABDOMEN: Soft and non-tender  MUSCULOSKELETAL: There are no major deformities or cyanosis. NEUROLOGIC: No focal weakness or paresthesias are detected. SKIN: R Darrick Grinder a amputation of her right second finger with eschar at the skin edge. No surrounding erythema PSYCHIATRIC: The patient has a normal  affect.  DATA:  Arterial noninvasive study as above with less infection flow in the right arm versus the left arm. She did undergo formal arch arteriogram and I reviewed the images of this. This is not very helpful. 2 injections were made which showed occlusion of the innominate and its takeoff but minimal distal information. The right subclavian and carotid are patent  She did have a CT scan in August 2017 for different indication. This shows extensive calcification of her arm innominate artery at that time. Is difficult to tell since this was a noninvasive dedicated CT angiogram whether the innominate artery was occluded at that time or not.  MEDICAL ISSUES: Had long discussion with the patient and her daughter. Explained that if this was related to ischemia that she has good chance of healing in this may take a while. The daughter reports that she feels that initial injury that precipitated this nonhealing ulcer was punctured her finger for glucose monitor check and causing trauma from this. There was some concern regarding embolic phenomenon as well. I explained that if her innominate artery is completely occluded this makes it much less than it likely that she would have an embolus from this. I have recommended that dedicated CT angiogram for further evaluation of the lesion. Explain the magnitude of surgery for revascularization which would require  sternotomy and a graft arising from her ascending arch. Obviously would like to avoid this. She has severe COPD on continuous oxygen and is in generalized failing health. We'll see her again in one month for further discussion. She is to see Dr.Kuzma and 1-2 weeks for continued follow-up of her partial finger amputation   Rosetta Posner, MD FACS [facility] Tel [phone] Pager [phone]

## 2017-04-20 DIAGNOSIS — D649 Anemia, unspecified: Secondary | ICD-10-CM | POA: Diagnosis not present

## 2017-04-22 DIAGNOSIS — D649 Anemia, unspecified: Secondary | ICD-10-CM | POA: Diagnosis not present

## 2017-04-24 DIAGNOSIS — N289 Disorder of kidney and ureter, unspecified: Secondary | ICD-10-CM | POA: Diagnosis not present

## 2017-04-24 DIAGNOSIS — E860 Dehydration: Secondary | ICD-10-CM | POA: Diagnosis not present

## 2017-04-24 DIAGNOSIS — I517 Cardiomegaly: Secondary | ICD-10-CM | POA: Diagnosis not present

## 2017-04-24 DIAGNOSIS — R0602 Shortness of breath: Secondary | ICD-10-CM | POA: Diagnosis not present

## 2017-04-28 DIAGNOSIS — R0902 Hypoxemia: Secondary | ICD-10-CM | POA: Diagnosis not present

## 2017-04-28 DIAGNOSIS — E860 Dehydration: Secondary | ICD-10-CM | POA: Diagnosis not present

## 2017-04-28 DIAGNOSIS — Z79899 Other long term (current) drug therapy: Secondary | ICD-10-CM | POA: Diagnosis not present

## 2017-04-28 DIAGNOSIS — N178 Other acute kidney failure: Secondary | ICD-10-CM | POA: Diagnosis not present

## 2017-04-28 DIAGNOSIS — J410 Simple chronic bronchitis: Secondary | ICD-10-CM | POA: Diagnosis not present

## 2017-05-01 DIAGNOSIS — R0902 Hypoxemia: Secondary | ICD-10-CM | POA: Diagnosis not present

## 2017-05-01 DIAGNOSIS — J441 Chronic obstructive pulmonary disease with (acute) exacerbation: Secondary | ICD-10-CM | POA: Diagnosis not present

## 2017-05-01 DIAGNOSIS — J449 Chronic obstructive pulmonary disease, unspecified: Secondary | ICD-10-CM | POA: Diagnosis not present

## 2017-05-03 DIAGNOSIS — I10 Essential (primary) hypertension: Secondary | ICD-10-CM | POA: Diagnosis not present

## 2017-05-03 DIAGNOSIS — E1142 Type 2 diabetes mellitus with diabetic polyneuropathy: Secondary | ICD-10-CM | POA: Diagnosis not present

## 2017-05-03 DIAGNOSIS — E782 Mixed hyperlipidemia: Secondary | ICD-10-CM | POA: Diagnosis not present

## 2017-05-05 DIAGNOSIS — R918 Other nonspecific abnormal finding of lung field: Secondary | ICD-10-CM | POA: Diagnosis not present

## 2017-05-05 DIAGNOSIS — I6529 Occlusion and stenosis of unspecified carotid artery: Secondary | ICD-10-CM | POA: Diagnosis not present

## 2017-05-05 DIAGNOSIS — I708 Atherosclerosis of other arteries: Secondary | ICD-10-CM | POA: Diagnosis not present

## 2017-05-05 DIAGNOSIS — J90 Pleural effusion, not elsewhere classified: Secondary | ICD-10-CM | POA: Diagnosis not present

## 2017-05-06 ENCOUNTER — Encounter: Payer: Self-pay | Admitting: Vascular Surgery

## 2017-05-09 DIAGNOSIS — D649 Anemia, unspecified: Secondary | ICD-10-CM | POA: Diagnosis not present

## 2017-05-10 DIAGNOSIS — D649 Anemia, unspecified: Secondary | ICD-10-CM | POA: Diagnosis not present

## 2017-05-12 ENCOUNTER — Telehealth: Payer: Self-pay | Admitting: *Deleted

## 2017-05-12 DIAGNOSIS — Z4801 Encounter for change or removal of surgical wound dressing: Secondary | ICD-10-CM | POA: Diagnosis not present

## 2017-05-12 DIAGNOSIS — L7682 Other postprocedural complications of skin and subcutaneous tissue: Secondary | ICD-10-CM | POA: Diagnosis not present

## 2017-05-12 DIAGNOSIS — S5011XA Contusion of right forearm, initial encounter: Secondary | ICD-10-CM | POA: Diagnosis not present

## 2017-05-12 NOTE — Telephone Encounter (Signed)
Per Dr. Luther Parody instructions, I called Dr. Dirk Dress [phone]) regarding this patient's CTA chest results from [facility]. Patient has a right lower lobe lung mass that is highly suspicious for primary lung cancer per Dr. Golden Circle' notes. Dr.Cox's nurse, Hoyle Sauer, will set up referrals for evaluation of this mass. The patient already has been seeing a doctor at the [facility] center for blood dyscrasias.   I also called the patient's daughter, Chauncey Fischer, and discussed the findings of the CTA. She voiced understanding that Dr. Tobie Poet would be contacting her for more instructions. Irene Shipper is the POA for her mother's care. She will let Mrs Winkowski know of this need to further investigate the mass. She also stated that her mother is seen at [facility] center.   Patient will keep her current appointment with Dr. Donnetta Hutching on 05-17-17 as planned.

## 2017-05-13 DIAGNOSIS — Z9981 Dependence on supplemental oxygen: Secondary | ICD-10-CM | POA: Diagnosis not present

## 2017-05-13 DIAGNOSIS — R918 Other nonspecific abnormal finding of lung field: Secondary | ICD-10-CM | POA: Diagnosis not present

## 2017-05-13 DIAGNOSIS — B1920 Unspecified viral hepatitis C without hepatic coma: Secondary | ICD-10-CM | POA: Diagnosis not present

## 2017-05-13 DIAGNOSIS — K5521 Angiodysplasia of colon with hemorrhage: Secondary | ICD-10-CM | POA: Diagnosis not present

## 2017-05-13 DIAGNOSIS — K7460 Unspecified cirrhosis of liver: Secondary | ICD-10-CM | POA: Diagnosis not present

## 2017-05-13 DIAGNOSIS — J449 Chronic obstructive pulmonary disease, unspecified: Secondary | ICD-10-CM | POA: Diagnosis not present

## 2017-05-13 DIAGNOSIS — D509 Iron deficiency anemia, unspecified: Secondary | ICD-10-CM | POA: Diagnosis not present

## 2017-05-17 ENCOUNTER — Ambulatory Visit: Payer: Medicare Other | Admitting: Vascular Surgery

## 2017-05-17 DIAGNOSIS — K5521 Angiodysplasia of colon with hemorrhage: Secondary | ICD-10-CM | POA: Diagnosis not present

## 2017-05-17 DIAGNOSIS — R918 Other nonspecific abnormal finding of lung field: Secondary | ICD-10-CM | POA: Diagnosis not present

## 2017-05-17 DIAGNOSIS — J90 Pleural effusion, not elsewhere classified: Secondary | ICD-10-CM | POA: Diagnosis not present

## 2017-05-17 DIAGNOSIS — R0609 Other forms of dyspnea: Secondary | ICD-10-CM | POA: Diagnosis not present

## 2017-05-17 DIAGNOSIS — R0602 Shortness of breath: Secondary | ICD-10-CM | POA: Diagnosis not present

## 2017-05-17 DIAGNOSIS — D649 Anemia, unspecified: Secondary | ICD-10-CM | POA: Diagnosis not present

## 2017-05-17 DIAGNOSIS — E1165 Type 2 diabetes mellitus with hyperglycemia: Secondary | ICD-10-CM | POA: Diagnosis not present

## 2017-05-17 DIAGNOSIS — R05 Cough: Secondary | ICD-10-CM | POA: Diagnosis not present

## 2017-05-17 DIAGNOSIS — E46 Unspecified protein-calorie malnutrition: Secondary | ICD-10-CM | POA: Diagnosis not present

## 2017-05-17 DIAGNOSIS — R911 Solitary pulmonary nodule: Secondary | ICD-10-CM | POA: Diagnosis not present

## 2017-05-17 DIAGNOSIS — K5520 Angiodysplasia of colon without hemorrhage: Secondary | ICD-10-CM | POA: Diagnosis not present

## 2017-05-17 DIAGNOSIS — E119 Type 2 diabetes mellitus without complications: Secondary | ICD-10-CM | POA: Diagnosis not present

## 2017-05-17 DIAGNOSIS — E785 Hyperlipidemia, unspecified: Secondary | ICD-10-CM | POA: Diagnosis not present

## 2017-05-17 DIAGNOSIS — J441 Chronic obstructive pulmonary disease with (acute) exacerbation: Secondary | ICD-10-CM | POA: Diagnosis not present

## 2017-05-17 DIAGNOSIS — E8770 Fluid overload, unspecified: Secondary | ICD-10-CM | POA: Diagnosis not present

## 2017-05-17 DIAGNOSIS — N189 Chronic kidney disease, unspecified: Secondary | ICD-10-CM | POA: Diagnosis not present

## 2017-05-17 DIAGNOSIS — Z8673 Personal history of transient ischemic attack (TIA), and cerebral infarction without residual deficits: Secondary | ICD-10-CM | POA: Diagnosis not present

## 2017-05-17 DIAGNOSIS — J449 Chronic obstructive pulmonary disease, unspecified: Secondary | ICD-10-CM | POA: Diagnosis not present

## 2017-05-17 DIAGNOSIS — E43 Unspecified severe protein-calorie malnutrition: Secondary | ICD-10-CM | POA: Diagnosis not present

## 2017-05-17 DIAGNOSIS — I517 Cardiomegaly: Secondary | ICD-10-CM | POA: Diagnosis not present

## 2017-05-17 DIAGNOSIS — K219 Gastro-esophageal reflux disease without esophagitis: Secondary | ICD-10-CM | POA: Diagnosis not present

## 2017-05-17 DIAGNOSIS — Z9981 Dependence on supplemental oxygen: Secondary | ICD-10-CM | POA: Diagnosis not present

## 2017-05-17 DIAGNOSIS — R600 Localized edema: Secondary | ICD-10-CM | POA: Diagnosis not present

## 2017-05-17 DIAGNOSIS — T380X5A Adverse effect of glucocorticoids and synthetic analogues, initial encounter: Secondary | ICD-10-CM | POA: Diagnosis not present

## 2017-05-17 DIAGNOSIS — D62 Acute posthemorrhagic anemia: Secondary | ICD-10-CM | POA: Diagnosis not present

## 2017-05-17 DIAGNOSIS — R5381 Other malaise: Secondary | ICD-10-CM | POA: Diagnosis not present

## 2017-05-17 DIAGNOSIS — I10 Essential (primary) hypertension: Secondary | ICD-10-CM | POA: Diagnosis not present

## 2017-05-17 DIAGNOSIS — Z79899 Other long term (current) drug therapy: Secondary | ICD-10-CM | POA: Diagnosis not present

## 2017-05-17 DIAGNOSIS — K7460 Unspecified cirrhosis of liver: Secondary | ICD-10-CM | POA: Diagnosis not present

## 2017-05-17 DIAGNOSIS — S81811A Laceration without foreign body, right lower leg, initial encounter: Secondary | ICD-10-CM | POA: Diagnosis not present

## 2017-05-17 DIAGNOSIS — Z7982 Long term (current) use of aspirin: Secondary | ICD-10-CM | POA: Diagnosis not present

## 2017-05-17 DIAGNOSIS — D509 Iron deficiency anemia, unspecified: Secondary | ICD-10-CM | POA: Diagnosis not present

## 2017-05-17 DIAGNOSIS — Z8781 Personal history of (healed) traumatic fracture: Secondary | ICD-10-CM | POA: Diagnosis not present

## 2017-05-17 DIAGNOSIS — D7589 Other specified diseases of blood and blood-forming organs: Secondary | ICD-10-CM | POA: Diagnosis not present

## 2017-05-17 DIAGNOSIS — C3490 Malignant neoplasm of unspecified part of unspecified bronchus or lung: Secondary | ICD-10-CM | POA: Diagnosis not present

## 2017-05-17 DIAGNOSIS — Z87891 Personal history of nicotine dependence: Secondary | ICD-10-CM | POA: Diagnosis not present

## 2017-05-17 DIAGNOSIS — J811 Chronic pulmonary edema: Secondary | ICD-10-CM | POA: Diagnosis not present

## 2017-05-18 ENCOUNTER — Telehealth: Payer: Self-pay | Admitting: Vascular Surgery

## 2017-05-18 NOTE — Telephone Encounter (Signed)
Spoke with the patient's daughter Jonelle Sidle by telephone. Her CT angiogram had reviewed probable lung cancer. She had a PET scan yesterday and is having further workup. I explained that her CT and grams did show complete occlusion of her innominate artery which would make it very unlikely that she is having embolus to her hand. She does have hand ischemia. Certainly would not recommend any entertainment for major thoracotomy for revascularization in light of her lung cancer. Will not see her back in the office and we'll be available to assist as needed.

## 2017-05-19 ENCOUNTER — Institutional Professional Consult (permissible substitution): Payer: Medicare Other | Admitting: Pulmonary Disease

## 2017-05-19 DIAGNOSIS — K5521 Angiodysplasia of colon with hemorrhage: Secondary | ICD-10-CM | POA: Diagnosis not present

## 2017-05-19 DIAGNOSIS — R911 Solitary pulmonary nodule: Secondary | ICD-10-CM | POA: Diagnosis not present

## 2017-05-19 DIAGNOSIS — J449 Chronic obstructive pulmonary disease, unspecified: Secondary | ICD-10-CM | POA: Diagnosis not present

## 2017-05-19 DIAGNOSIS — D509 Iron deficiency anemia, unspecified: Secondary | ICD-10-CM | POA: Diagnosis not present

## 2017-05-19 DIAGNOSIS — Z9981 Dependence on supplemental oxygen: Secondary | ICD-10-CM | POA: Diagnosis not present

## 2017-05-19 DIAGNOSIS — E43 Unspecified severe protein-calorie malnutrition: Secondary | ICD-10-CM | POA: Diagnosis not present

## 2017-05-19 DIAGNOSIS — J811 Chronic pulmonary edema: Secondary | ICD-10-CM | POA: Diagnosis not present

## 2017-05-19 DIAGNOSIS — N189 Chronic kidney disease, unspecified: Secondary | ICD-10-CM | POA: Diagnosis not present

## 2017-05-20 ENCOUNTER — Other Ambulatory Visit (HOSPITAL_COMMUNITY)
Admission: RE | Admit: 2017-05-20 | Discharge: 2017-05-20 | Disposition: A | Discharge status: Home / Self Care | Payer: Medicare Other | Source: Ambulatory Visit | Attending: Oncology | Admitting: Oncology

## 2017-05-20 DIAGNOSIS — D509 Iron deficiency anemia, unspecified: Secondary | ICD-10-CM | POA: Diagnosis not present

## 2017-05-20 DIAGNOSIS — D649 Anemia, unspecified: Secondary | ICD-10-CM | POA: Diagnosis not present

## 2017-05-20 DIAGNOSIS — D7589 Other specified diseases of blood and blood-forming organs: Secondary | ICD-10-CM | POA: Diagnosis not present

## 2017-05-20 DIAGNOSIS — R918 Other nonspecific abnormal finding of lung field: Secondary | ICD-10-CM | POA: Diagnosis not present

## 2017-05-22 DIAGNOSIS — C3491 Malignant neoplasm of unspecified part of right bronchus or lung: Secondary | ICD-10-CM | POA: Diagnosis not present

## 2017-05-22 DIAGNOSIS — Z8673 Personal history of transient ischemic attack (TIA), and cerebral infarction without residual deficits: Secondary | ICD-10-CM | POA: Diagnosis not present

## 2017-05-22 DIAGNOSIS — D649 Anemia, unspecified: Secondary | ICD-10-CM | POA: Diagnosis not present

## 2017-05-22 DIAGNOSIS — E119 Type 2 diabetes mellitus without complications: Secondary | ICD-10-CM | POA: Diagnosis not present

## 2017-05-22 DIAGNOSIS — Z79899 Other long term (current) drug therapy: Secondary | ICD-10-CM | POA: Diagnosis not present

## 2017-05-22 DIAGNOSIS — S80812D Abrasion, left lower leg, subsequent encounter: Secondary | ICD-10-CM | POA: Diagnosis not present

## 2017-05-22 DIAGNOSIS — Z89021 Acquired absence of right finger(s): Secondary | ICD-10-CM | POA: Diagnosis not present

## 2017-05-22 DIAGNOSIS — Z9981 Dependence on supplemental oxygen: Secondary | ICD-10-CM | POA: Diagnosis not present

## 2017-05-22 DIAGNOSIS — M1991 Primary osteoarthritis, unspecified site: Secondary | ICD-10-CM | POA: Diagnosis not present

## 2017-05-22 DIAGNOSIS — J439 Emphysema, unspecified: Secondary | ICD-10-CM | POA: Diagnosis not present

## 2017-05-22 DIAGNOSIS — I10 Essential (primary) hypertension: Secondary | ICD-10-CM | POA: Diagnosis not present

## 2017-05-22 DIAGNOSIS — Z4781 Encounter for orthopedic aftercare following surgical amputation: Secondary | ICD-10-CM | POA: Diagnosis not present

## 2017-05-22 DIAGNOSIS — J90 Pleural effusion, not elsewhere classified: Secondary | ICD-10-CM | POA: Diagnosis not present

## 2017-05-23 DIAGNOSIS — Z79899 Other long term (current) drug therapy: Secondary | ICD-10-CM | POA: Diagnosis not present

## 2017-05-23 DIAGNOSIS — J439 Emphysema, unspecified: Secondary | ICD-10-CM | POA: Diagnosis not present

## 2017-05-23 DIAGNOSIS — S80812D Abrasion, left lower leg, subsequent encounter: Secondary | ICD-10-CM | POA: Diagnosis not present

## 2017-05-23 DIAGNOSIS — I10 Essential (primary) hypertension: Secondary | ICD-10-CM | POA: Diagnosis not present

## 2017-05-23 DIAGNOSIS — J90 Pleural effusion, not elsewhere classified: Secondary | ICD-10-CM | POA: Diagnosis not present

## 2017-05-23 DIAGNOSIS — Z9981 Dependence on supplemental oxygen: Secondary | ICD-10-CM | POA: Diagnosis not present

## 2017-05-23 DIAGNOSIS — M1991 Primary osteoarthritis, unspecified site: Secondary | ICD-10-CM | POA: Diagnosis not present

## 2017-05-23 DIAGNOSIS — Z89021 Acquired absence of right finger(s): Secondary | ICD-10-CM | POA: Diagnosis not present

## 2017-05-23 DIAGNOSIS — Z4781 Encounter for orthopedic aftercare following surgical amputation: Secondary | ICD-10-CM | POA: Diagnosis not present

## 2017-05-23 DIAGNOSIS — C3491 Malignant neoplasm of unspecified part of right bronchus or lung: Secondary | ICD-10-CM | POA: Diagnosis not present

## 2017-05-23 DIAGNOSIS — D649 Anemia, unspecified: Secondary | ICD-10-CM | POA: Diagnosis not present

## 2017-05-23 DIAGNOSIS — E119 Type 2 diabetes mellitus without complications: Secondary | ICD-10-CM | POA: Diagnosis not present

## 2017-05-23 DIAGNOSIS — Z8673 Personal history of transient ischemic attack (TIA), and cerebral infarction without residual deficits: Secondary | ICD-10-CM | POA: Diagnosis not present

## 2017-05-25 DIAGNOSIS — Z89021 Acquired absence of right finger(s): Secondary | ICD-10-CM | POA: Diagnosis not present

## 2017-05-25 DIAGNOSIS — Z9981 Dependence on supplemental oxygen: Secondary | ICD-10-CM | POA: Diagnosis not present

## 2017-05-25 DIAGNOSIS — Z8673 Personal history of transient ischemic attack (TIA), and cerebral infarction without residual deficits: Secondary | ICD-10-CM | POA: Diagnosis not present

## 2017-05-25 DIAGNOSIS — E119 Type 2 diabetes mellitus without complications: Secondary | ICD-10-CM | POA: Diagnosis not present

## 2017-05-25 DIAGNOSIS — D649 Anemia, unspecified: Secondary | ICD-10-CM | POA: Diagnosis not present

## 2017-05-25 DIAGNOSIS — S80812D Abrasion, left lower leg, subsequent encounter: Secondary | ICD-10-CM | POA: Diagnosis not present

## 2017-05-25 DIAGNOSIS — Z79899 Other long term (current) drug therapy: Secondary | ICD-10-CM | POA: Diagnosis not present

## 2017-05-25 DIAGNOSIS — M1991 Primary osteoarthritis, unspecified site: Secondary | ICD-10-CM | POA: Diagnosis not present

## 2017-05-25 DIAGNOSIS — J90 Pleural effusion, not elsewhere classified: Secondary | ICD-10-CM | POA: Diagnosis not present

## 2017-05-25 DIAGNOSIS — I10 Essential (primary) hypertension: Secondary | ICD-10-CM | POA: Diagnosis not present

## 2017-05-25 DIAGNOSIS — Z4781 Encounter for orthopedic aftercare following surgical amputation: Secondary | ICD-10-CM | POA: Diagnosis not present

## 2017-05-25 DIAGNOSIS — C3491 Malignant neoplasm of unspecified part of right bronchus or lung: Secondary | ICD-10-CM | POA: Diagnosis not present

## 2017-05-25 DIAGNOSIS — J439 Emphysema, unspecified: Secondary | ICD-10-CM | POA: Diagnosis not present

## 2017-05-26 DIAGNOSIS — Z89021 Acquired absence of right finger(s): Secondary | ICD-10-CM | POA: Diagnosis not present

## 2017-05-26 DIAGNOSIS — J439 Emphysema, unspecified: Secondary | ICD-10-CM | POA: Diagnosis not present

## 2017-05-26 DIAGNOSIS — S80812D Abrasion, left lower leg, subsequent encounter: Secondary | ICD-10-CM | POA: Diagnosis not present

## 2017-05-26 DIAGNOSIS — I10 Essential (primary) hypertension: Secondary | ICD-10-CM | POA: Diagnosis not present

## 2017-05-26 DIAGNOSIS — Z8673 Personal history of transient ischemic attack (TIA), and cerebral infarction without residual deficits: Secondary | ICD-10-CM | POA: Diagnosis not present

## 2017-05-26 DIAGNOSIS — C3491 Malignant neoplasm of unspecified part of right bronchus or lung: Secondary | ICD-10-CM | POA: Diagnosis not present

## 2017-05-26 DIAGNOSIS — Z4781 Encounter for orthopedic aftercare following surgical amputation: Secondary | ICD-10-CM | POA: Diagnosis not present

## 2017-05-26 DIAGNOSIS — Z9981 Dependence on supplemental oxygen: Secondary | ICD-10-CM | POA: Diagnosis not present

## 2017-05-26 DIAGNOSIS — D649 Anemia, unspecified: Secondary | ICD-10-CM | POA: Diagnosis not present

## 2017-05-26 DIAGNOSIS — J90 Pleural effusion, not elsewhere classified: Secondary | ICD-10-CM | POA: Diagnosis not present

## 2017-05-26 DIAGNOSIS — E119 Type 2 diabetes mellitus without complications: Secondary | ICD-10-CM | POA: Diagnosis not present

## 2017-05-26 DIAGNOSIS — Z79899 Other long term (current) drug therapy: Secondary | ICD-10-CM | POA: Diagnosis not present

## 2017-05-26 DIAGNOSIS — M1991 Primary osteoarthritis, unspecified site: Secondary | ICD-10-CM | POA: Diagnosis not present

## 2017-05-27 DIAGNOSIS — R0902 Hypoxemia: Secondary | ICD-10-CM | POA: Diagnosis not present

## 2017-05-27 DIAGNOSIS — J90 Pleural effusion, not elsewhere classified: Secondary | ICD-10-CM | POA: Diagnosis not present

## 2017-05-27 DIAGNOSIS — I10 Essential (primary) hypertension: Secondary | ICD-10-CM | POA: Diagnosis not present

## 2017-05-27 DIAGNOSIS — R600 Localized edema: Secondary | ICD-10-CM | POA: Diagnosis not present

## 2017-05-30 DIAGNOSIS — D649 Anemia, unspecified: Secondary | ICD-10-CM | POA: Diagnosis not present

## 2017-05-30 DIAGNOSIS — S80812D Abrasion, left lower leg, subsequent encounter: Secondary | ICD-10-CM | POA: Diagnosis not present

## 2017-05-30 DIAGNOSIS — M1991 Primary osteoarthritis, unspecified site: Secondary | ICD-10-CM | POA: Diagnosis not present

## 2017-05-30 DIAGNOSIS — Z9981 Dependence on supplemental oxygen: Secondary | ICD-10-CM | POA: Diagnosis not present

## 2017-05-30 DIAGNOSIS — C3491 Malignant neoplasm of unspecified part of right bronchus or lung: Secondary | ICD-10-CM | POA: Diagnosis not present

## 2017-05-30 DIAGNOSIS — I10 Essential (primary) hypertension: Secondary | ICD-10-CM | POA: Diagnosis not present

## 2017-05-30 DIAGNOSIS — Z4781 Encounter for orthopedic aftercare following surgical amputation: Secondary | ICD-10-CM | POA: Diagnosis not present

## 2017-05-30 DIAGNOSIS — Z8673 Personal history of transient ischemic attack (TIA), and cerebral infarction without residual deficits: Secondary | ICD-10-CM | POA: Diagnosis not present

## 2017-05-30 DIAGNOSIS — Z79899 Other long term (current) drug therapy: Secondary | ICD-10-CM | POA: Diagnosis not present

## 2017-05-30 DIAGNOSIS — Z89021 Acquired absence of right finger(s): Secondary | ICD-10-CM | POA: Diagnosis not present

## 2017-05-30 DIAGNOSIS — J90 Pleural effusion, not elsewhere classified: Secondary | ICD-10-CM | POA: Diagnosis not present

## 2017-05-30 DIAGNOSIS — J439 Emphysema, unspecified: Secondary | ICD-10-CM | POA: Diagnosis not present

## 2017-05-30 DIAGNOSIS — E119 Type 2 diabetes mellitus without complications: Secondary | ICD-10-CM | POA: Diagnosis not present

## 2017-05-31 DIAGNOSIS — D500 Iron deficiency anemia secondary to blood loss (chronic): Secondary | ICD-10-CM | POA: Diagnosis not present

## 2017-05-31 DIAGNOSIS — J449 Chronic obstructive pulmonary disease, unspecified: Secondary | ICD-10-CM | POA: Diagnosis not present

## 2017-05-31 DIAGNOSIS — Q2733 Arteriovenous malformation of digestive system vessel: Secondary | ICD-10-CM | POA: Diagnosis not present

## 2017-05-31 DIAGNOSIS — D649 Anemia, unspecified: Secondary | ICD-10-CM | POA: Diagnosis not present

## 2017-05-31 DIAGNOSIS — R918 Other nonspecific abnormal finding of lung field: Secondary | ICD-10-CM | POA: Diagnosis not present

## 2017-05-31 DIAGNOSIS — Z9981 Dependence on supplemental oxygen: Secondary | ICD-10-CM | POA: Diagnosis not present

## 2017-06-01 DIAGNOSIS — K7460 Unspecified cirrhosis of liver: Secondary | ICD-10-CM | POA: Diagnosis not present

## 2017-06-01 DIAGNOSIS — C779 Secondary and unspecified malignant neoplasm of lymph node, unspecified: Secondary | ICD-10-CM | POA: Diagnosis not present

## 2017-06-01 DIAGNOSIS — R0902 Hypoxemia: Secondary | ICD-10-CM | POA: Diagnosis not present

## 2017-06-01 DIAGNOSIS — R911 Solitary pulmonary nodule: Secondary | ICD-10-CM | POA: Diagnosis not present

## 2017-06-01 DIAGNOSIS — I517 Cardiomegaly: Secondary | ICD-10-CM | POA: Diagnosis not present

## 2017-06-01 DIAGNOSIS — I2510 Atherosclerotic heart disease of native coronary artery without angina pectoris: Secondary | ICD-10-CM | POA: Diagnosis not present

## 2017-06-01 DIAGNOSIS — J441 Chronic obstructive pulmonary disease with (acute) exacerbation: Secondary | ICD-10-CM | POA: Diagnosis not present

## 2017-06-01 DIAGNOSIS — J449 Chronic obstructive pulmonary disease, unspecified: Secondary | ICD-10-CM | POA: Diagnosis not present

## 2017-06-01 DIAGNOSIS — I700 Atherosclerosis of aorta: Secondary | ICD-10-CM | POA: Diagnosis not present

## 2017-06-03 DIAGNOSIS — Z9981 Dependence on supplemental oxygen: Secondary | ICD-10-CM | POA: Diagnosis not present

## 2017-06-03 DIAGNOSIS — E119 Type 2 diabetes mellitus without complications: Secondary | ICD-10-CM | POA: Diagnosis not present

## 2017-06-03 DIAGNOSIS — D649 Anemia, unspecified: Secondary | ICD-10-CM | POA: Diagnosis not present

## 2017-06-03 DIAGNOSIS — J439 Emphysema, unspecified: Secondary | ICD-10-CM | POA: Diagnosis not present

## 2017-06-03 DIAGNOSIS — M1991 Primary osteoarthritis, unspecified site: Secondary | ICD-10-CM | POA: Diagnosis not present

## 2017-06-03 DIAGNOSIS — S80812D Abrasion, left lower leg, subsequent encounter: Secondary | ICD-10-CM | POA: Diagnosis not present

## 2017-06-03 DIAGNOSIS — Z8673 Personal history of transient ischemic attack (TIA), and cerebral infarction without residual deficits: Secondary | ICD-10-CM | POA: Diagnosis not present

## 2017-06-03 DIAGNOSIS — Z89021 Acquired absence of right finger(s): Secondary | ICD-10-CM | POA: Diagnosis not present

## 2017-06-03 DIAGNOSIS — Z79899 Other long term (current) drug therapy: Secondary | ICD-10-CM | POA: Diagnosis not present

## 2017-06-03 DIAGNOSIS — I10 Essential (primary) hypertension: Secondary | ICD-10-CM | POA: Diagnosis not present

## 2017-06-03 DIAGNOSIS — C3491 Malignant neoplasm of unspecified part of right bronchus or lung: Secondary | ICD-10-CM | POA: Diagnosis not present

## 2017-06-03 DIAGNOSIS — J90 Pleural effusion, not elsewhere classified: Secondary | ICD-10-CM | POA: Diagnosis not present

## 2017-06-03 DIAGNOSIS — Z4781 Encounter for orthopedic aftercare following surgical amputation: Secondary | ICD-10-CM | POA: Diagnosis not present

## 2017-06-06 DIAGNOSIS — D509 Iron deficiency anemia, unspecified: Secondary | ICD-10-CM | POA: Diagnosis not present

## 2017-06-07 ENCOUNTER — Other Ambulatory Visit: Payer: Self-pay | Admitting: Orthopedic Surgery

## 2017-06-07 DIAGNOSIS — Z79899 Other long term (current) drug therapy: Secondary | ICD-10-CM | POA: Diagnosis not present

## 2017-06-07 DIAGNOSIS — J90 Pleural effusion, not elsewhere classified: Secondary | ICD-10-CM | POA: Diagnosis not present

## 2017-06-07 DIAGNOSIS — Z9981 Dependence on supplemental oxygen: Secondary | ICD-10-CM | POA: Diagnosis not present

## 2017-06-07 DIAGNOSIS — Z4781 Encounter for orthopedic aftercare following surgical amputation: Secondary | ICD-10-CM | POA: Diagnosis not present

## 2017-06-07 DIAGNOSIS — M1991 Primary osteoarthritis, unspecified site: Secondary | ICD-10-CM | POA: Diagnosis not present

## 2017-06-07 DIAGNOSIS — S80812D Abrasion, left lower leg, subsequent encounter: Secondary | ICD-10-CM | POA: Diagnosis not present

## 2017-06-07 DIAGNOSIS — Z89021 Acquired absence of right finger(s): Secondary | ICD-10-CM | POA: Diagnosis not present

## 2017-06-07 DIAGNOSIS — D649 Anemia, unspecified: Secondary | ICD-10-CM | POA: Diagnosis not present

## 2017-06-07 DIAGNOSIS — Z8673 Personal history of transient ischemic attack (TIA), and cerebral infarction without residual deficits: Secondary | ICD-10-CM | POA: Diagnosis not present

## 2017-06-07 DIAGNOSIS — C3491 Malignant neoplasm of unspecified part of right bronchus or lung: Secondary | ICD-10-CM | POA: Diagnosis not present

## 2017-06-07 DIAGNOSIS — I10 Essential (primary) hypertension: Secondary | ICD-10-CM | POA: Diagnosis not present

## 2017-06-07 DIAGNOSIS — E119 Type 2 diabetes mellitus without complications: Secondary | ICD-10-CM | POA: Diagnosis not present

## 2017-06-07 DIAGNOSIS — J439 Emphysema, unspecified: Secondary | ICD-10-CM | POA: Diagnosis not present

## 2017-06-08 ENCOUNTER — Encounter (HOSPITAL_COMMUNITY): Payer: Self-pay | Admitting: *Deleted

## 2017-06-08 MED ORDER — VANCOMYCIN HCL IN DEXTROSE 1-5 GM/200ML-% IV SOLN
1000.0000 mg | INTRAVENOUS | Status: AC
  Administered 2017-06-09: 1000 mg via INTRAVENOUS
  Filled 2017-06-08: qty 200

## 2017-06-08 NOTE — Progress Notes (Signed)
Spoke with pt's daughter, Brett Albino, [place] for pt. She states pt is diabetic, type 2. Last A1C was 5.1 in May, 2018. Fasting blood sugar is usually between 100-120. Denies cardiac history or chest pain. Has SOB due to COPD, Asthma and new hx of Lung cancer. Instructed Tammy to have pt check her blood sugar in the AM when wakes up and every 2 hours until she leaves for the hospital. If blood sugar is 70 or below, treat with 1/2 cup of clear juice (apple or cranberry) and recheck blood sugar 15 minutes after drinking juice. If blood sugar continues to be 70 or below, call the Short Stay department and ask to speak to a nurse.

## 2017-06-09 ENCOUNTER — Encounter (HOSPITAL_COMMUNITY): Payer: Self-pay | Admitting: Surgery

## 2017-06-09 ENCOUNTER — Ambulatory Visit (HOSPITAL_COMMUNITY)
Admission: RE | Admit: 2017-06-09 | Discharge: 2017-06-09 | Disposition: A | Discharge status: Home / Self Care | Payer: Medicare Other | Source: Ambulatory Visit | Attending: Orthopedic Surgery | Admitting: Orthopedic Surgery

## 2017-06-09 ENCOUNTER — Encounter (HOSPITAL_COMMUNITY)
Admission: RE | Disposition: A | Discharge status: Home / Self Care | Payer: Self-pay | Source: Ambulatory Visit | Attending: Orthopedic Surgery

## 2017-06-09 ENCOUNTER — Ambulatory Visit (HOSPITAL_COMMUNITY): Payer: Medicare Other | Admitting: Anesthesiology

## 2017-06-09 DIAGNOSIS — Z880 Allergy status to penicillin: Secondary | ICD-10-CM | POA: Diagnosis not present

## 2017-06-09 DIAGNOSIS — E1152 Type 2 diabetes mellitus with diabetic peripheral angiopathy with gangrene: Secondary | ICD-10-CM | POA: Insufficient documentation

## 2017-06-09 DIAGNOSIS — I96 Gangrene, not elsewhere classified: Secondary | ICD-10-CM | POA: Insufficient documentation

## 2017-06-09 DIAGNOSIS — B1920 Unspecified viral hepatitis C without hepatic coma: Secondary | ICD-10-CM | POA: Insufficient documentation

## 2017-06-09 DIAGNOSIS — G250 Essential tremor: Secondary | ICD-10-CM | POA: Diagnosis not present

## 2017-06-09 DIAGNOSIS — E7800 Pure hypercholesterolemia, unspecified: Secondary | ICD-10-CM | POA: Diagnosis not present

## 2017-06-09 DIAGNOSIS — K7460 Unspecified cirrhosis of liver: Secondary | ICD-10-CM | POA: Insufficient documentation

## 2017-06-09 DIAGNOSIS — Z9981 Dependence on supplemental oxygen: Secondary | ICD-10-CM | POA: Insufficient documentation

## 2017-06-09 DIAGNOSIS — I639 Cerebral infarction, unspecified: Secondary | ICD-10-CM | POA: Diagnosis not present

## 2017-06-09 DIAGNOSIS — J449 Chronic obstructive pulmonary disease, unspecified: Secondary | ICD-10-CM | POA: Diagnosis not present

## 2017-06-09 DIAGNOSIS — M1990 Unspecified osteoarthritis, unspecified site: Secondary | ICD-10-CM | POA: Insufficient documentation

## 2017-06-09 DIAGNOSIS — Z8673 Personal history of transient ischemic attack (TIA), and cerebral infarction without residual deficits: Secondary | ICD-10-CM | POA: Insufficient documentation

## 2017-06-09 DIAGNOSIS — I10 Essential (primary) hypertension: Secondary | ICD-10-CM | POA: Insufficient documentation

## 2017-06-09 DIAGNOSIS — F410 Panic disorder [episodic paroxysmal anxiety] without agoraphobia: Secondary | ICD-10-CM | POA: Diagnosis not present

## 2017-06-09 DIAGNOSIS — Z79899 Other long term (current) drug therapy: Secondary | ICD-10-CM | POA: Insufficient documentation

## 2017-06-09 DIAGNOSIS — K219 Gastro-esophageal reflux disease without esophagitis: Secondary | ICD-10-CM | POA: Insufficient documentation

## 2017-06-09 DIAGNOSIS — F329 Major depressive disorder, single episode, unspecified: Secondary | ICD-10-CM | POA: Insufficient documentation

## 2017-06-09 DIAGNOSIS — F17210 Nicotine dependence, cigarettes, uncomplicated: Secondary | ICD-10-CM | POA: Diagnosis not present

## 2017-06-09 DIAGNOSIS — Z89021 Acquired absence of right finger(s): Secondary | ICD-10-CM | POA: Diagnosis not present

## 2017-06-09 HISTORY — DX: Melena: K92.1

## 2017-06-09 HISTORY — PX: AMPUTATION: SHX166

## 2017-06-09 HISTORY — DX: Peripheral vascular disease, unspecified: I73.9

## 2017-06-09 HISTORY — DX: Malignant (primary) neoplasm, unspecified: C80.1

## 2017-06-09 HISTORY — DX: Dyspnea, unspecified: R06.00

## 2017-06-09 LAB — BASIC METABOLIC PANEL
Anion gap: 9 (ref 5–15)
BUN: 26 mg/dL — AB (ref 6–20)
CALCIUM: 8.6 mg/dL — AB (ref 8.9–10.3)
CHLORIDE: 107 mmol/L (ref 101–111)
CO2: 22 mmol/L (ref 22–32)
CREATININE: 1.39 mg/dL — AB (ref 0.44–1.00)
GFR calc non Af Amer: 36 mL/min — ABNORMAL LOW (ref >60)
GFR, EST AFRICAN AMERICAN: 42 mL/min — AB (ref >60)
Glucose, Bld: 137 mg/dL — ABNORMAL HIGH (ref 65–99)
Potassium: 4.2 mmol/L (ref 3.5–5.1)
SODIUM: 138 mmol/L (ref 135–145)

## 2017-06-09 LAB — GLUCOSE, CAPILLARY: GLUCOSE-CAPILLARY: 114 mg/dL — AB (ref 65–99)

## 2017-06-09 LAB — CBC
HCT: 29.3 % — ABNORMAL LOW (ref 36.0–46.0)
Hemoglobin: 9 g/dL — ABNORMAL LOW (ref 12.0–15.0)
MCH: 29.7 pg (ref 26.0–34.0)
MCHC: 30.7 g/dL (ref 30.0–36.0)
MCV: 96.7 fL (ref 78.0–100.0)
PLATELETS: 196 10*3/uL (ref 150–400)
RBC: 3.03 MIL/uL — ABNORMAL LOW (ref 3.87–5.11)
RDW: 22.2 % — AB (ref 11.5–15.5)
WBC: 6.4 10*3/uL (ref 4.0–10.5)

## 2017-06-09 SURGERY — AMPUTATION DIGIT
Anesthesia: Monitor Anesthesia Care | Site: Finger | Laterality: Right

## 2017-06-09 MED ORDER — FENTANYL CITRATE (PF) 100 MCG/2ML IJ SOLN
25.0000 ug | Freq: Once | INTRAMUSCULAR | Status: AC
  Administered 2017-06-09 (×2): 25 ug via INTRAVENOUS
  Filled 2017-06-09: qty 0.5

## 2017-06-09 MED ORDER — BUPIVACAINE HCL (PF) 0.25 % IJ SOLN
INTRAMUSCULAR | Status: AC
  Filled 2017-06-09: qty 30

## 2017-06-09 MED ORDER — HYDROCODONE-ACETAMINOPHEN 5-325 MG PO TABS: 1.0000 | ORAL_TABLET | Freq: Four times a day (QID) | ORAL | 0 refills | Status: DC | PRN

## 2017-06-09 MED ORDER — FENTANYL CITRATE (PF) 100 MCG/2ML IJ SOLN
INTRAMUSCULAR | Status: AC
  Administered 2017-06-09: 25 ug via INTRAVENOUS
  Filled 2017-06-09: qty 2

## 2017-06-09 MED ORDER — PROPOFOL 500 MG/50ML IV EMUL
INTRAVENOUS | Status: DC | PRN
  Administered 2017-06-09: 50 ug/kg/min via INTRAVENOUS

## 2017-06-09 MED ORDER — FENTANYL CITRATE (PF) 250 MCG/5ML IJ SOLN
INTRAMUSCULAR | Status: AC
  Filled 2017-06-09: qty 5

## 2017-06-09 MED ORDER — PROPOFOL 10 MG/ML IV BOLUS
INTRAVENOUS | Status: AC
  Filled 2017-06-09: qty 20

## 2017-06-09 MED ORDER — LACTATED RINGERS IV SOLN
INTRAVENOUS | Status: DC
  Administered 2017-06-09: 14:00:00 via INTRAVENOUS

## 2017-06-09 MED ORDER — LIDOCAINE HCL (PF) 1 % IJ SOLN
INTRAMUSCULAR | Status: DC | PRN
Start: 2017-06-09 — End: 2017-06-09
  Administered 2017-06-09: 5 mL

## 2017-06-09 MED ORDER — LIDOCAINE HCL (PF) 1 % IJ SOLN
INTRAMUSCULAR | Status: AC
  Filled 2017-06-09: qty 30

## 2017-06-09 MED ORDER — CHLORHEXIDINE GLUCONATE 4 % EX LIQD: 60.0000 mL | Freq: Once | CUTANEOUS | Status: DC

## 2017-06-09 MED ORDER — BUPIVACAINE HCL (PF) 0.25 % IJ SOLN
INTRAMUSCULAR | Status: DC | PRN
  Administered 2017-06-09: 5 mL

## 2017-06-09 MED ORDER — HYDROMORPHONE HCL 1 MG/ML IJ SOLN
INTRAMUSCULAR | Status: DC
Start: 2017-06-09 — End: 2017-06-09
  Filled 2017-06-09: qty 1

## 2017-06-09 SURGICAL SUPPLY — 40 items
BNDG COHESIVE 1X5 TAN STRL LF (GAUZE/BANDAGES/DRESSINGS) ×3 IMPLANT
BNDG COHESIVE 3X5 TAN STRL LF (GAUZE/BANDAGES/DRESSINGS) ×3 IMPLANT
BNDG ESMARK 4X9 LF (GAUZE/BANDAGES/DRESSINGS) ×3 IMPLANT
BNDG GAUZE ELAST 4 BULKY (GAUZE/BANDAGES/DRESSINGS) IMPLANT
CORDS BIPOLAR (ELECTRODE) ×3 IMPLANT
CUFF TOURNIQUET SINGLE 18IN (TOURNIQUET CUFF) ×3 IMPLANT
CUFF TOURNIQUET SINGLE 24IN (TOURNIQUET CUFF) IMPLANT
DRSG KUZMA FLUFF (GAUZE/BANDAGES/DRESSINGS) IMPLANT
DURAPREP 26ML APPLICATOR (WOUND CARE) ×3 IMPLANT
GAUZE SPONGE 4X4 12PLY STRL (GAUZE/BANDAGES/DRESSINGS) IMPLANT
GAUZE XEROFORM 1X8 LF (GAUZE/BANDAGES/DRESSINGS) ×3 IMPLANT
GLOVE BIO SURGEON STRL SZ 6.5 (GLOVE) ×2 IMPLANT
GLOVE BIO SURGEONS STRL SZ 6.5 (GLOVE) ×1
GLOVE BIOGEL PI IND STRL 8.5 (GLOVE) ×1 IMPLANT
GLOVE BIOGEL PI INDICATOR 8.5 (GLOVE) ×2
GLOVE SURG ORTHO 8.0 STRL STRW (GLOVE) ×3 IMPLANT
GOWN STRL REUS W/ TWL LRG LVL3 (GOWN DISPOSABLE) ×2 IMPLANT
GOWN STRL REUS W/ TWL XL LVL3 (GOWN DISPOSABLE) ×1 IMPLANT
GOWN STRL REUS W/TWL LRG LVL3 (GOWN DISPOSABLE) ×4
GOWN STRL REUS W/TWL XL LVL3 (GOWN DISPOSABLE) ×2
KIT BASIN OR (CUSTOM PROCEDURE TRAY) ×3 IMPLANT
KIT ROOM TURNOVER OR (KITS) ×3 IMPLANT
MANIFOLD NEPTUNE II (INSTRUMENTS) ×3 IMPLANT
NEEDLE HYPO 25GX1X1/2 BEV (NEEDLE) ×3 IMPLANT
NS IRRIG 1000ML POUR BTL (IV SOLUTION) ×3 IMPLANT
PACK ORTHO EXTREMITY (CUSTOM PROCEDURE TRAY) ×3 IMPLANT
PAD ARMBOARD 7.5X6 YLW CONV (MISCELLANEOUS) ×6 IMPLANT
PAD CAST 4YDX4 CTTN HI CHSV (CAST SUPPLIES) ×1 IMPLANT
PADDING CAST COTTON 4X4 STRL (CAST SUPPLIES) ×2
RUBBERBAND STERILE (MISCELLANEOUS) ×3 IMPLANT
SPECIMEN JAR SMALL (MISCELLANEOUS) ×3 IMPLANT
SPLINT FINGER (SOFTGOODS) ×3 IMPLANT
SUT ETHILON 5 0 PS 2 18 (SUTURE) ×3 IMPLANT
SUT SILK 4 0 PS 2 (SUTURE) IMPLANT
SUT VICRYL 4-0 PS2 18IN ABS (SUTURE) ×3 IMPLANT
SYR CONTROL 10ML LL (SYRINGE) ×3 IMPLANT
TOWEL OR 17X24 6PK STRL BLUE (TOWEL DISPOSABLE) ×3 IMPLANT
TOWEL OR 17X26 10 PK STRL BLUE (TOWEL DISPOSABLE) ×3 IMPLANT
UNDERPAD 30X30 (UNDERPADS AND DIAPERS) ×3 IMPLANT
WATER STERILE IRR 1000ML POUR (IV SOLUTION) ×3 IMPLANT

## 2017-06-09 NOTE — Brief Op Note (Signed)
06/09/2017  4:27 PM  PATIENT:  O[place] Peak  76 y.o. female  PRE-OPERATIVE DIAGNOSIS:  GANGRENE RIGHT INDEX FINGER  POST-OPERATIVE DIAGNOSIS:  GANGRENE RIGHT INDEX FINGER  PROCEDURE:  Procedure(s): AMPUTATION RIGHT  INDEX FINGER (Right)  SURGEON:  Surgeon(s) and Role:    Daryll Brod, MD - Primary  PHYSICIAN ASSISTANT:   ASSISTANTS: none   ANESTHESIA:   local and IV sedation  EBL:  No intake/output data recorded.  BLOOD ADMINISTERED:none  DRAINS: none   LOCAL MEDICATIONS USED:  BUPIVICAINE  and XYLOCAINE   SPECIMEN:  No Specimen  DISPOSITION OF SPECIMEN:  N/A  COUNTS:  YES  TOURNIQUET:   Total Tourniquet Time Documented: Forearm (Right) - 10 minutes Total: Forearm (Right) - 10 minutes   DICTATION: .Other Dictation: Dictation Number [id number]  PLAN OF CARE: Discharge to home after PACU  PATIENT DISPOSITION:  PACU - hemodynamically stable.

## 2017-06-09 NOTE — Anesthesia Preprocedure Evaluation (Addendum)
Anesthesia Evaluation  Patient identified by MRN, date of birth, ID band Patient awake    Reviewed: Allergy & Precautions, H&P , NPO status , Patient's Chart, lab work & pertinent test results  Airway Mallampati: III  TM Distance: >3 FB Neck ROM: Full    Dental no notable dental hx. (+) Upper Dentures, Lower Dentures, Dental Advisory Given   Pulmonary asthma , COPD,  COPD inhaler and oxygen dependent, former smoker,    Pulmonary exam normal breath sounds clear to auscultation       Cardiovascular hypertension, Pt. on medications + Peripheral Vascular Disease   Rhythm:Regular Rate:Normal     Neuro/Psych  Headaches, Seizures -,  Anxiety Depression CVA    GI/Hepatic GERD  Medicated and Controlled,(+) Cirrhosis       , Hepatitis -  Endo/Other  diabetes  Renal/GU negative Renal ROS  negative genitourinary   Musculoskeletal  (+) Arthritis , Osteoarthritis,    Abdominal   Peds  Hematology negative hematology ROS (+) anemia ,   Anesthesia Other Findings   Reproductive/Obstetrics negative OB ROS                            Anesthesia Physical Anesthesia Plan  ASA: III  Anesthesia Plan: MAC   Post-op Pain Management:    Induction: Intravenous  PONV Risk Score and Plan: 2 and Ondansetron and Propofol  Airway Management Planned: Simple Face Mask  Additional Equipment:   Intra-op Plan:   Post-operative Plan:   Informed Consent: I have reviewed the patients History and Physical, chart, labs and discussed the procedure including the risks, benefits and alternatives for the proposed anesthesia with the patient or authorized representative who has indicated his/her understanding and acceptance.   Dental advisory given  Plan Discussed with: CRNA  Anesthesia Plan Comments:        Anesthesia Quick Evaluation

## 2017-06-09 NOTE — Op Note (Signed)
Dictation Number [id number]

## 2017-06-09 NOTE — H&P (Signed)
Lindsay Juarez is an 76 y.o. female.   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 She states that they rewrapped her finger to tight. She comes in now with some necrosis of the volar flap. Is minimal erythema proximally to this. Is also a small embolus on her ring finger.  Marland Kitchen Unfortunately this has turned out to be cancer. Is complaining of significant pain to the right index finger. She has been on an antibiotic and attempt to prevent this from becoming grossly infected.      Past Medical History:  Diagnosis Date  . Anemia, unspecified   . Asthma   . Blood in stool   . Bradycardia   . Cancer ([place])    lung dx 05/2017  . Chronic low back pain   . Chronic tension headache   . Cirrhosis ([place])   . Common migraine   . COPD (chronic obstructive pulmonary disease) ([place])   . Degenerative disc disease   . Depression   . Diabetes mellitus without complication ([place])   . Dyspnea   . Edema   . Familial tremor   . GERD (gastroesophageal reflux disease)   . Glaucoma   . Hepatitis    Hep C in the past; had treatment  . Hypercholesteremia   . Hypertension   . Impaired fasting glucose   . Memory loss   . Osteoarthritis   . Oxygen dependent   . Panic attack   . Peripheral vascular disease ([place])   . Plantar wart   . Pneumonia   . Rhabdomyolysis   . Seizures ([place])    only 1 - ? 2011  . Stroke [facility])    showed  up on CT  . Toxic metabolic encephalopathy     Past Surgical History:  Procedure Laterality Date  . AMPUTATION Right 03/15/2017   Procedure: AMPUTATION RIGHT INDEX FINGER;  Surgeon: Daryll Brod, MD;  Location: [place];  Service: Orthopedics;  Laterality: Right;  . APPENDECTOMY     had as a child  . CHOLECYSTECTOMY  1970's  . IR GENERIC HISTORICAL  02/21/2017   IR ARCH CERVICICEBRAL NON-SEL (MS) 02/21/2017 Corrie Mckusick, DO [facility]-INTERV RAD  . IR GENERIC HISTORICAL  02/21/2017   IR US GUIDE VASC ACCESS RIGHT 02/21/2017 Corrie Mckusick, DO [facility]-INTER[facility] RAD  . left arm fx repair  2008  . right ankle fx repair  1980's  . right arm fx repair  2006  . right leg fx repair  2010   multiple fractures  . TONSILLECTOMY    . VAGINAL HYSTERECTOMY  1960's    Family History  Problem Relation Age of Onset  . Asthma Mother   . COPD Mother   . Hyperlipidemia Mother   . Heart attack Mother   . Lung cancer Mother   . Migraines Mother   . Osteoarthritis Mother   . Depression Mother   . Asthma Grandchild   . Hyperlipidemia Sister   . Hypertension Father   . Congestive Heart Failure Father   . Hypertension Maternal Aunt   . Melanoma Unknown        grandmother  . Migraines Grandchild   . Osteoarthritis Daughter   . Depression Daughter   . Depression Grandchild   . OCD Grandchild    Social History:  reports that she quit smoking about 7 months ago. Her smoking use  included Cigarettes. She started smoking about 61 years ago. She has a 58.00 pack-year smoking history. She has never used smokeless tobacco. She reports that she does not drink alcohol or use drugs.  Allergies:  Allergies  Allergen Reactions  . Carbamazepine Other (See Comments)    Bleeding Ears  . Levofloxacin Hives, Itching, Swelling and Rash    SWELLING REACTION UNSPECIFIED   . Morphine Nausea And Vomiting    Other reaction(s): Confusion  . Penicillins Hives, Itching and Rash    Has patient had a PCN reaction causing immediate rash, facial/tongue/throat swelling, SOB or lightheadedness with hypotension: Unknown Has patient had a PCN reaction causing severe rash involving mucus membranes or skin necrosis: Unknown Has patient had a PCN reaction that required hospitalization: No Has patient had a PCN reaction occurring within the last 10 years: No If all of the above answers are "NO", then may proceed with Cephalosporin use.   . Theophylline Hives  and Nausea Only  . Avelox [Moxifloxacin] Nausea And Vomiting    Upset stomach  . Cardizem [Diltiazem Hcl] Rash and Other (See Comments)    Muscle spasm   . Cefuroxime Axetil Nausea And Vomiting  . Doxepin Rash and Other (See Comments)    Drawing up sensation  . Gatifloxacin Rash  . Lithium Carbonate [Lithium] Itching and Rash  . Morphine And Related Other (See Comments)    Confusion  . Nadolol Rash and Other (See Comments)    BRADYCARDIA  . Oxycodone Itching and Rash  . Prozac [Fluoxetine Hcl] Other (See Comments)    "makes me mean."  . Vitamin K And Related Other (See Comments)    Vitamin k shots - takes skin pigmentation off arm  . Xanax [Alprazolam] Other (See Comments)    Tiredness/"falls"--too strong     Medications Prior to Admission  Medication Sig Dispense Refill  . albuterol (PROVENTIL HFA;VENTOLIN HFA) 108 (90 BASE) MCG/ACT inhaler Inhale 2 puffs into the lungs as needed for wheezing or shortness of breath.     . budesonide-formoterol (SYMBICORT) 160-4.5 MCG/ACT inhaler Inhale 2 puffs into the lungs 2 (two) times daily. 1 Inhaler 12  . citalopram (CELEXA) 40 MG tablet Take 40 mg by mouth daily.     . Dulaglutide (TRULICITY) 5.68 LE/7.5TZ SOPN Inject 0.75 mg into the skin every Friday.    . furosemide (LASIX) 20 MG tablet Take 20 mg by mouth See admin instructions. Up to three (3) times daily for fluid retention.    Marland Kitchen ibuprofen (ADVIL,MOTRIN) 200 MG tablet Take 400 mg by mouth every 6 (six) hours as needed (for pain.).    Marland Kitchen ipratropium-albuterol (DUONEB) 0.5-2.5 (3) MG/3ML SOLN Take 3 mLs by nebulization as needed (shortness of breath).     . latanoprost (XALATAN) 0.005 % ophthalmic solution Place 1 drop into both eyes at bedtime.     Marland Kitchen lisinopril (PRINIVIL,ZESTRIL) 20 MG tablet Take 20 mg by mouth daily.    . montelukast (SINGULAIR) 10 MG tablet Take 1 tablet (10 mg total) by mouth at bedtime. (Patient taking differently: Take 10 mg by mouth daily. ) 30 tablet 11  .  OXYGEN Inhale 3 L into the lungs continuous. 24 hrs day     . pantoprazole (PROTONIX) 40 MG tablet Take 80 mg by mouth daily before breakfast.    . potassium chloride SA (K-DUR,KLOR-CON) 20 MEQ tablet Take 20 mEq by mouth daily.     . Tiotropium Bromide Monohydrate (SPIRIVA RESPIMAT) 2.5 MCG/ACT AERS Inhale 2 puffs into the lungs  daily.      No results found for this or any previous visit (from the past 48 hour(s)).  No results found.   Pertinent items are noted in HPI.  Blood pressure (!) 149/36, temperature 98.1 F (36.7 C), temperature source Oral, height 5' (1.524 m), weight 78.5 kg (173 lb).  General appearance: alert, cooperative and appears stated age Head: Normocephalic, without obvious abnormality Neck: no JVD Resp: clear to auscultation bilaterally Cardio: regular rate and rhythm, S1, S2 normal, no murmur, click, rub or gallop GI: soft, non-tender; bowel sounds normal; no masses,  no organomegaly Extremities: gangrene right index finger Pulses: 2+ and symmetric Skin: Skin color, texture, turgor normal. No rashes or lesions Neurologic: Grossly normal Incision/Wound: Open tip  Assessment/Plan Assessment:  1. Gangrene ([place])    Plan: We have discussed revision of her amputation more proximal with her. This completed on her under MAC. Aware of potential for necrosis of this.       Lucresha Dismuke R 06/09/2017, 1:41 PM

## 2017-06-09 NOTE — Discharge Instructions (Addendum)

## 2017-06-09 NOTE — Op Note (Signed)
NAME:  Lindsay Juarez, Lindsay Juarez              ACCOUNT NO.:  [id number]  MEDICAL RECORD NO.:  [id number]  LOCATION:                                 FACILITY:  PHYSICIAN:  Daryll Brod, M.D.            DATE OF BIRTH:  DATE OF PROCEDURE:  06/09/2017 DATE OF DISCHARGE:                              OPERATIVE REPORT   PREOPERATIVE DIAGNOSE:  Gangrene, right index finger.  POSTOPERATIVE DIAGNOSIS:  Gangrene, right index finger.  OPERATION:  Revision amputation, right index finger.  SURGEON:  Daryll Brod, M.D.  ASSISTANT:  None.  ANESTHESIA:  Metacarpal block with sedation.  PLACE OF SURGERY:  [place]:  [facility].  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 She has undergone a partial amputation of the tip of the finger for gangrene.  Unfortunately, she has had further loss of tissue.  She now has exposed bone and is admitted now for further revision over the amputation.  Preoperative, perioperative, and postoperative courses have been discussed along with risks and complications.  She is aware that there is no guarantee to the surgery; the possibility of infection; recurrence of injury to arteries, nerves, tendons; incomplete relief of symptoms; dystrophy.  In the preoperative area, the patient was seen, the extremity marked by both patient and surgeon.  Antibiotic given.  PROCEDURE IN DETAIL:  The patient was brought to the operating room, where she was prepped using ChloraPrep in a supine position with the right arm free.  Prior to the prep, a metacarpal block was given with 0.25% bupivacaine, 1% Xylocaine both without epinephrine, 10 mL was used.  Prep was done after adequate anesthesia was afforded.  Time-out was taken at the beginning of the block confirming patient and procedure.  A 3-minute dry time was allowed.  After adequate anesthesia was afforded, a Penrose  drain was used for tourniquet control at the base of the finger.  The gangrene was excised, there was no purulence. A rongeur was then used to remove the bone more proximally, this was done after making mid lateral incisions radially and ulnarly.  The bone was rongeured back to the midportion of the middle phalanx.  The wound was copiously irrigated with saline.  The 2 skin flaps were then loosely closed with interrupted 4-0 nylon sutures.  A sterile compressive dressing and splint were applied after removal of the tourniquet.  The patient tolerated the procedure well and was taken to the recovery room for observation in satisfactory condition.  She will be discharged to home to return to the [facility] in 1 week, on Norco.          ______________________________ Daryll Brod, M.D.     GK/MEDQ  D:  06/09/2017  T:  06/09/2017  Job:  [id number]

## 2017-06-09 NOTE — Transfer of Care (Signed)
Immediate Anesthesia Transfer of Care Note  Patient: Lindsay Juarez  Procedure(s) Performed: Procedure(s): AMPUTATION RIGHT  INDEX FINGER (Right)  Patient Location: PACU  Anesthesia Type:MAC combined with regional for post-op pain  Level of Consciousness: awake and alert   Airway & Oxygen Therapy: Patient Spontanous Breathing and Patient connected to nasal cannula oxygen  Post-op Assessment: Report given to RN and Post -op Vital signs reviewed and stable  Post vital signs: Reviewed and stable  Last Vitals:  Vitals:   06/09/17 1339 06/09/17 1634  BP: (!) 143/42   Pulse: 76   Temp:  36.6 C    Last Pain:  Vitals:   06/09/17 1634  TempSrc:   PainSc: 0-No pain         Complications: No apparent anesthesia complications

## 2017-06-10 ENCOUNTER — Encounter (HOSPITAL_COMMUNITY): Payer: Self-pay | Admitting: Orthopedic Surgery

## 2017-06-13 NOTE — Anesthesia Postprocedure Evaluation (Signed)
Anesthesia Post Note  Patient: Lindsay Juarez  Procedure(s) Performed: Procedure(s) (LRB): AMPUTATION RIGHT  INDEX FINGER (Right)     Patient location during evaluation: PACU Anesthesia Type: MAC Level of consciousness: awake and alert Pain management: pain level controlled Vital Signs Assessment: post-procedure vital signs reviewed and stable Respiratory status: spontaneous breathing, nonlabored ventilation, respiratory function stable and patient connected to nasal cannula oxygen Cardiovascular status: stable and blood pressure returned to baseline Anesthetic complications: no    Last Vitals:  Vitals:   06/09/17 1634 06/09/17 1712  BP:  (!) 124/52  Pulse:  78  Resp:  16  Temp: 36.6 C     Last Pain:  Vitals:   06/09/17 1712  TempSrc:   PainSc: 0-No pain                 Voyd Groft

## 2017-06-15 ENCOUNTER — Encounter (HOSPITAL_COMMUNITY): Payer: Self-pay

## 2017-06-20 ENCOUNTER — Ambulatory Visit (INDEPENDENT_AMBULATORY_CARE_PROVIDER_SITE_OTHER): Payer: Medicare Other | Admitting: Internal Medicine

## 2017-06-20 VITALS — BP 120/68 | HR 90 | Ht 59.0 in | Wt 166.6 lb

## 2017-06-20 DIAGNOSIS — I10 Essential (primary) hypertension: Secondary | ICD-10-CM | POA: Diagnosis not present

## 2017-06-20 DIAGNOSIS — J449 Chronic obstructive pulmonary disease, unspecified: Secondary | ICD-10-CM | POA: Diagnosis not present

## 2017-06-20 DIAGNOSIS — R918 Other nonspecific abnormal finding of lung field: Secondary | ICD-10-CM | POA: Diagnosis not present

## 2017-06-20 MED ORDER — PANTOPRAZOLE SODIUM 40 MG PO TBEC: 40.0000 mg | DELAYED_RELEASE_TABLET | Freq: Every day | ORAL | 2 refills | Status: DC

## 2017-06-20 MED ORDER — PANTOPRAZOLE SODIUM 40 MG PO TBEC: DELAYED_RELEASE_TABLET | ORAL | Status: AC

## 2017-06-20 MED ORDER — HYDROCODONE-ACETAMINOPHEN 5-325 MG PO TABS: 1.0000 | ORAL_TABLET | Freq: Four times a day (QID) | ORAL | 0 refills | Status: DC | PRN

## 2017-06-20 MED ORDER — VALSARTAN-HYDROCHLOROTHIAZIDE 160-25 MG PO TABS: 1.0000 | ORAL_TABLET | Freq: Every day | ORAL | 11 refills | Status: AC

## 2017-06-20 MED ORDER — BENZONATATE 200 MG PO CAPS: 200.0000 mg | ORAL_CAPSULE | Freq: Three times a day (TID) | ORAL | 1 refills | Status: AC | PRN

## 2017-06-20 NOTE — Patient Instructions (Addendum)
For cough >> tessalon 200 mg four times daily   For pain > norco  5 mg every 4 hours as needed   protonix 40 mg Take 30- 60 min before your first and last meals of the day   Stop inhalers  just for now duoneb four times a day   GERD (REFLUX)  is an extremely common cause of respiratory symptoms just like yours , many times with no obvious heartburn at all.    It can be treated with medication, but also with lifestyle changes including elevation of the head of your bed (ideally with 6 inch  bed blocks),  Smoking cessation, avoidance of late meals, excessive alcohol, and avoid fatty foods, chocolate, peppermint, colas, red wine, and acidic juices such as orange juice.  NO MINT OR MENTHOL PRODUCTS SO NO COUGH DROPS   USE SUGARLESS CANDY INSTEAD (Jolley ranchers or Stover's or Life Savers) or even ice chips will also do - the key is to swallow to prevent all throat clearing. NO OIL BASED VITAMINS - use powdered substitutes.    Stop lisnipril and take diovan 160 -25 mg one daily (ok to double if needed)    Please schedule a follow up office visit in 4 weeks, sooner if needed  with all medications /inhalers/ solutions in hand so we can verify exactly what you are taking. This includes all medications from all doctors and over the counters

## 2017-06-20 NOTE — Progress Notes (Signed)
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 Advice Only    Referred by Dr. Hinton Rao due to having a mass found in lung from a PET scan that was said to be cancerous. Pt is SOB all the time with a dry cough, and occ. chest pain as stated by pt, "feels like someone is sticking a knife in me." Pt also has difficulty with swallowing. DME: [employer], 3L O2 worn all the time.  onset of cough x > 1 y prior to OV   indolent onset progressively worse harsh barking day > noct assoc with dysphagia CP ant R of sternum  radiates to back to front like an arrow going thru her  lasts from a minutes - 3-4 minutes but no real pleuritic quality.  Doe x rest but sleeps ok/ no better with laba/ics/lama and duoneb. Previous to d/c smoking  MMRC2 = can't walk a nl pace on a flat grade s sob but does fine slow and flat now sob at rest  No obvious day to day or daytime variability or assoc excess/ purulent sputum or mucus plugs or hemoptysis or   chest tightness, subjective wheeze or overt sinus or hb symptoms. No unusual exp hx or h/o childhood pna/ asthma or knowledge of premature birth.  Sleeping ok without nocturnal  or early am exacerbation  of respiratory  c/o's or need for noct saba. Also denies any obvious fluctuation of symptoms with weather or environmental changes or other aggravating or alleviating factors except as outlined above   Current Medications,  Allergies, Complete Past Medical History, Past Surgical History, Family History, and Social History were reviewed in [employer] record.  ROS  The following are not active complaints unless bolded sore throat, dysphagia, dental problems, itching, sneezing,  nasal congestion or excess/ purulent secretions, ear ache,   fever, chills, sweats, unintended wt loss, classically pleuritic or exertional cp,  orthopnea pnd or leg swelling, presyncope, palpitations, abdominal pain, anorexia, nausea, vomiting, diarrhea  or change in bowel or bladder habits, change in stools or urine, dysuria,hematuria,  rash, arthralgias, visual complaints, headache, numbness, weakness or ataxia or problems with walking or coordination,  change in mood/affect or memory.           Review of Systems     Objective:   Physical Exam    amb elderly wf nad at rest until starts a barking severe shrill coughing fit  Wt Readings from Last 3 Encounters:  06/20/17 166 lb 9.6 oz (75.6 kg)  06/09/17 173 lb (78.5 kg)  04/19/17 167 lb (75.8 kg)    Vital signs reviewed  - Note on arrival 02 sats  95% on 3lpm      HEENT: nl dentition, turbinates bilaterally, and oropharynx. Nl external ear canals without cough reflex   NECK :  without JVD/Nodes/TM/ nl carotid upstrokes bilaterally   LUNGS: no acc muscle use,  Nl contour chest with prominant pseudowheeze and min true insp/exp rhonchi   CV:  RRR  no s3 or murmur or increase in P2, and trace bilateral lower ext pitting  edema   ABD:  soft and nontender with nl inspiratory excursion in the supine position. No bruits or organomegaly appreciated, bowel sounds nl  MS:  Nl gait/ ext warm without deformities, calf tenderness, cyanosis or clubbing No obvious joint restrictions   SKIN: warm and dry without lesions    NEURO:  alert, approp, nl sensorium with  no motor or cerebellar deficits apparent.      I personally reviewed images and agree with  radiology impression as follows:   Chest CT PET  05/07/17 2.1x 2.1 RLL central nodule with SUV 7.6 and 1.1 cm R infra hilar noed with suv 3.3 c/w IIA dz     Assessment:

## 2017-06-21 ENCOUNTER — Encounter: Payer: Self-pay | Admitting: Internal Medicine

## 2017-06-21 ENCOUNTER — Telehealth: Payer: Self-pay | Admitting: Internal Medicine

## 2017-06-21 DIAGNOSIS — R918 Other nonspecific abnormal finding of lung field: Secondary | ICD-10-CM | POA: Insufficient documentation

## 2017-06-21 DIAGNOSIS — I10 Essential (primary) hypertension: Secondary | ICD-10-CM | POA: Insufficient documentation

## 2017-06-21 NOTE — Telephone Encounter (Signed)
MW  Please Advise-  Daughter called on behalf of the pt concerned that they checked pt's BP 30 mins ago and it was 88/44. They understand her BP medication was changed and is unsure if they should take her to the hospital.   4. Tanda Rockers, MD (Physician) at 06/20/2017 2:41 PM - Signed    For cough >> tessalon 200 mg four times daily   For pain > norco  5 mg every 4 hours as needed   protonix 40 mg Take 30- 60 min before your first and last meals of the day   Stop inhalers  just for now duoneb four times a day   GERD (REFLUX)  is an extremely common cause of respiratory symptoms just like yours , many times with no obvious heartburn at all.    It can be treated with medication, but also with lifestyle changes including elevation of the head of your bed (ideally with 6 inch  bed blocks),  Smoking cessation, avoidance of late meals, excessive alcohol, and avoid fatty foods, chocolate, peppermint, colas, red wine, and acidic juices such as orange juice.  NO MINT OR MENTHOL PRODUCTS SO NO COUGH DROPS   USE SUGARLESS CANDY INSTEAD (Jolley ranchers or Stover's or Life Savers) or even ice chips will also do - the key is to swallow to prevent all throat clearing. NO OIL BASED VITAMINS - use powdered substitutes.    Stop lisnipril and take diovan 160 -25 mg one daily (ok to double if needed)    Please schedule a follow up office visit in 4 weeks, sooner if needed  with all medications /inhalers/ solutions in hand so we can verify exactly what you are taking. This includes all medications from all doctors and over the counters

## 2017-06-21 NOTE — Telephone Encounter (Signed)
No need to go to er - I gave her a bp med that had a bit of diuretic in it so rec hold both lasix and diovan x 24 h then restart just the diovan but take one half twice daily so can figure out whether the whole pill is really needed  For ow, stay off feet and drink some fluids, maybe a few saltines

## 2017-06-21 NOTE — Assessment & Plan Note (Signed)
No spirometry on file - d/c acei 06/20/2017    Symptoms are markedly disproportionate to objective findings and not clear this is actually much of a  lung problem but pt does appear to have difficult to sort out respiratory symptoms of unknown origin for which  DDX  = almost all start with A and  include Adherence, Ace Inhibitors, Acid Reflux, Active Sinus Disease, Alpha 1 Antitripsin deficiency, Anxiety masquerading as Airways dz,  ABPA,  Allergy(esp in young), Aspiration (esp in elderly), Adverse effects of meds,  Active smokers, A bunch of PE's (a small clot burden can't cause this syndrome unless there is already severe underlying pulm or vascular dz with poor reserve) plus two Bs  = Bronchiectasis and Beta blocker use..and one C= CHF   Adherence is always the initial "prime suspect" and is a multilayered concern that requires a "trust but verify" approach in every patient - starting with knowing how to use medications, especially inhalers, correctly, keeping up with refills and understanding the fundamental difference between maintenance and prns vs those medications only taken for a very short course and then stopped and not refilled.  - change to just duoneb to siimplify care and reduce risk the inhalers are contributing to her cough - return with all meds in hand using a trust but verify approach to confirm accurate Medication  Reconciliation The principal here is that until we are certain that the  patients are doing what we've asked, it makes no sense to ask them to do more.   ACEi adverse effects at the  top of the usual list of suspects and the only way to rule it out is a trial off > see a/p    ? Acid (or non-acid) GERD > always difficult to exclude as up to 75% of pts in some series report no assoc GI/ Heartburn symptoms and she has dyphagia and h/o needing dilation > rec max (24h)  acid suppression and diet restrictions/ reviewed and instructions given in writing.   ? Active smoking  surreptitiously > denies   ? Allergy > continue singulair/ allegra   ? Anxiety > usually at the bottom of this list of usual suspects but should be much higher on this pt's based on H and P and note already on psychotropics .   ? Bronchiectasis > excluded by recent CT chest    We need to see how much better we can get her symptoms before deciding on whether she's a candidate for GA for EBUS/ navigational bronchochosopy (see separate a/p)   Total time devoted to counseling  > 50 % of initial 60 min office visit:  review case with pt/ discussion of options/alternatives/ personally creating written customized instructions  in presence of pt  then going over those specific  Instructions directly with the pt including how to use all of the meds but in particular covering each new medication in detail and the difference between the maintenance= "automatic" meds and the prns using an action plan format for the latter (If this problem/symptom => do that organization reading Left to right).  Please see AVS from this visit for a full list of these instructions which I personally wrote for this pt and  are unique to this visit.

## 2017-06-21 NOTE — Assessment & Plan Note (Signed)
Trial off acei 06/20/2017 due to barking cough and resting sob   In the best review of chronic cough to date ( NEJM 2016 375 [id number]) ,  ACEi are now felt to cause cough in up to  20% of pts which is a 4 fold increase from previous reports and does not include the variety of non-specific complaints we see in pulmonary clinic in pts on ACEi but previously attributed to another dx like  Copd/asthma and  include PNDS, throat and chest congestion, "bronchitis", unexplained dyspnea and noct "strangling" sensations, and hoarseness, but also  atypical /refractory GERD symptoms like dysphagia and "bad heartburn"  - so she has multiple symptoms to  which the acei may be contributing esp now that she's quit smoking it's hard to believe how much worse her "copd" has become  The only way I know  to prove this is not an "ACEi Case" is a trial off ACEi x a minimum of 6 weeks then regroup.   Try diovan 160-25 one daily

## 2017-06-21 NOTE — Assessment & Plan Note (Signed)
unforturnately she's in no shape for intervention at this point and this lesion is not likely the cause of any of her symptoms  Discussed in detail all the  indications, usual  risks and alternatives  relative to the benefits with patient who agrees to proceed with conservative f/u as outlined  With spirometry on return then see if could undergo FOB/EBUS/ navigational fob under GA

## 2017-06-21 NOTE — Telephone Encounter (Signed)
Spoke with pt's daughter, aware of recs.  Nothing further needed.

## 2017-06-24 LAB — CHROMOSOME ANALYSIS, BONE MARROW

## 2017-06-28 DIAGNOSIS — R05 Cough: Secondary | ICD-10-CM | POA: Diagnosis not present

## 2017-06-28 DIAGNOSIS — J410 Simple chronic bronchitis: Secondary | ICD-10-CM | POA: Diagnosis not present

## 2017-06-28 DIAGNOSIS — R531 Weakness: Secondary | ICD-10-CM | POA: Diagnosis not present

## 2017-06-29 DIAGNOSIS — D509 Iron deficiency anemia, unspecified: Secondary | ICD-10-CM | POA: Diagnosis not present

## 2017-06-30 DIAGNOSIS — D509 Iron deficiency anemia, unspecified: Secondary | ICD-10-CM | POA: Diagnosis not present

## 2017-07-01 DIAGNOSIS — R0902 Hypoxemia: Secondary | ICD-10-CM | POA: Diagnosis not present

## 2017-07-01 DIAGNOSIS — J441 Chronic obstructive pulmonary disease with (acute) exacerbation: Secondary | ICD-10-CM | POA: Diagnosis not present

## 2017-07-01 DIAGNOSIS — J449 Chronic obstructive pulmonary disease, unspecified: Secondary | ICD-10-CM | POA: Diagnosis not present

## 2017-07-01 NOTE — Anesthesia Postprocedure Evaluation (Signed)
Anesthesia Post Note  Patient: Lindsay Juarez  Procedure(s) Performed: Procedure(s) (LRB): AMPUTATION RIGHT INDEX FINGER (Right)     Anesthesia Post Evaluation  Last Vitals:  Vitals:   03/15/17 0926 03/15/17 0940  BP: (!) 135/54   Pulse: 86   Resp: 16   Temp:  36.7 C    Last Pain:  Vitals:   03/15/17 0940  TempSrc:   PainSc: 0-No pain                 Nolon Nations

## 2017-07-01 NOTE — Addendum Note (Signed)
Addendum  created 07/01/17 0934 by Nolon Nations, MD   Sign clinical note

## 2017-07-04 DIAGNOSIS — J449 Chronic obstructive pulmonary disease, unspecified: Secondary | ICD-10-CM | POA: Diagnosis not present

## 2017-07-04 DIAGNOSIS — R079 Chest pain, unspecified: Secondary | ICD-10-CM | POA: Diagnosis not present

## 2017-07-04 DIAGNOSIS — R05 Cough: Secondary | ICD-10-CM | POA: Diagnosis not present

## 2017-07-04 DIAGNOSIS — R918 Other nonspecific abnormal finding of lung field: Secondary | ICD-10-CM | POA: Diagnosis not present

## 2017-07-08 DIAGNOSIS — M79604 Pain in right leg: Secondary | ICD-10-CM | POA: Diagnosis not present

## 2017-07-18 ENCOUNTER — Encounter: Payer: Self-pay | Admitting: Internal Medicine

## 2017-07-18 ENCOUNTER — Ambulatory Visit (INDEPENDENT_AMBULATORY_CARE_PROVIDER_SITE_OTHER): Payer: Medicare Other | Admitting: Internal Medicine

## 2017-07-18 VITALS — BP 108/68 | HR 72 | Ht 59.0 in | Wt 165.6 lb

## 2017-07-18 DIAGNOSIS — J449 Chronic obstructive pulmonary disease, unspecified: Secondary | ICD-10-CM | POA: Diagnosis not present

## 2017-07-18 DIAGNOSIS — R918 Other nonspecific abnormal finding of lung field: Secondary | ICD-10-CM

## 2017-07-18 DIAGNOSIS — I10 Essential (primary) hypertension: Secondary | ICD-10-CM | POA: Diagnosis not present

## 2017-07-18 DIAGNOSIS — J9611 Chronic respiratory failure with hypoxia: Secondary | ICD-10-CM | POA: Insufficient documentation

## 2017-07-18 MED ORDER — HYDROCODONE-IBUPROFEN 7.5-200 MG PO TABS: 1.0000 | ORAL_TABLET | ORAL | 0 refills | Status: AC | PRN

## 2017-07-18 NOTE — Progress Notes (Signed)
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 Advice Only    Referred by Dr. Hinton Rao due to having a mass found in lung from a PET scan that was said to be cancerous. Pt is SOB all the time with a dry cough, and occ. chest pain as stated by pt, "feels like someone is sticking a knife in me." Pt also has difficulty with swallowing. DME: [employer], 3L O2 worn all the time.  onset of cough x > 1 y prior to OV   indolent onset progressively worse harsh barking day > noct assoc with dysphagia CP ant R of sternum  radiates to back to front like an arrow going thru her  lasts from a minutes - 3-4 minutes but no real pleuritic quality.  Doe x rest but sleeps ok/ no better with laba/ics/lama and duoneb. Previous to d/c smoking  MMRC2 = can't walk a nl pace on a flat grade s sob but does fine slow and flat now sob at rest rec For cough >> tessalon 200 mg four times daily  For pain > norco  5 mg every 4 hours as needed  Protonix 40 mg Take 30- 60 min before your first and last meals of the day  Stop inhalers  just for now duoneb four times a day  GERD diet Stop lisnipril and take diovan 160 -25 mg one daily (ok to double if needed)  Please schedule a follow up office visit in 4 weeks, sooner if needed  with all medications /inhalers/ solutions in hand so we can verify exactly what you are taking. This  includes all medications from all doctors and over the counters     07/18/2017  f/u ov/[name] re:   F/u copd GOLD 0 / no meds on just the duoneb qid prn and no rx yet today  Chief Complaint  Patient presents with  . Follow-up    Pt states that cough has improved since med. was changed. Still c/o SOB and occ. CP. Pt also states that her rt arm hurts all the time due to the blockage. DME: [employer], 3L O2.  doe = rooom to room on 02  Sleeps on side R side down Cough much better off acei / still dry   No obvious day to day or daytime variability or assoc excess/ purulent sputum or mucus plugs or hemoptysis or  chest tightness, subjective wheeze or overt sinus or hb symptoms. No unusual exp hx or h/o childhood pna/ asthma or knowledge of premature birth.  Sleeping ok without nocturnal  or early am exacerbation  of respiratory  c/o's or need for noct saba. Also denies any obvious fluctuation of symptoms with weather or environmental changes or other aggravating or alleviating factors except as outlined above  Current Medications, Allergies, Complete Past Medical History, Past Surgical History, Family History, and Social History were reviewed in [employer] record.  ROS  The following are not active complaints unless bolded sore throat, dysphagia, dental problems, itching, sneezing,  nasal congestion or excess/ purulent secretions, ear ache,   fever, chills, sweats, unintended wt loss, classically pleuritic or exertional cp,  orthopnea pnd or leg swelling, presyncope, palpitations, abdominal pain, anorexia, nausea, vomiting, diarrhea  or change in bowel or bladder habits, change in stools or urine, dysuria,hematuria,  rash, arthralgias, visual complaints, headache, numbness, weakness or ataxia or problems with walking or coordination,  change in mood/affect or memory.                Objective:   Physical Exam    W/c bound elderly wf nad at rest    07/18/2017       165    06/20/17 166 lb 9.6 oz (75.6 kg)  06/09/17 173 lb (78.5 kg)  04/19/17 167 lb (75.8 kg)    Vital signs reviewed  - Note on arrival 02 sats  89% on 3lpm  Pulsed     HEENT: nl dentition, turbinates bilaterally, and oropharynx. Nl external ear canals without cough reflex   NECK :  without JVD/Nodes/TM/ nl carotid upstrokes bilaterally   LUNGS: no acc muscle use,  Nl contour chest  With minimal insp/exp rhonchi more on R than L   CV:  RRR  no s3 or murmur or increase in P2, and trace bilateral lower ext pitting  edema   ABD:  soft and nontender with nl inspiratory excursion in the supine position. No bruits or organomegaly appreciated, bowel sounds nl  MS:  ext warm without deformities, calf tenderness, cyanosis or clubbing No obvious joint restrictions   SKIN: warm and dry without lesions    NEURO:  alert, approp, nl sensorium with  no motor or cerebellar deficits apparent.      I personally reviewed images and agree with radiology impression as follows:   Chest CT PET  05/07/17 2.1x 2.1 RLL central nodule with SUV 7.6 and 1.1 cm R infra hilar noed with suv 3.3 c/w IIA dz     Assessment:       Outpatient Encounter Prescriptions as of 07/18/2017  Medication Sig  . albuterol (PROVENTIL HFA;VENTOLIN HFA) 108 (90 BASE) MCG/ACT inhaler Inhale 2 puffs into the lungs as needed for wheezing or shortness of breath.   . benzonatate (TESSALON) 200 MG capsule Take 1 capsule (200 mg total) by mouth 3 (three) times daily as needed for cough.  . citalopram (CELEXA) 40 MG tablet Take 40 mg by mouth daily.   Marland Kitchen doxycycline (VIBRAMYCIN) 100 MG capsule Take 100 mg by mouth.  . Dulaglutide (TRULICITY) 6.44 IH/4.7QQ SOPN Inject 0.75 mg into the skin every Friday.  . fexofenadine (Juarez) 30 MG tablet Take 30 mg by mouth daily.  . furosemide (LASIX) 20 MG tablet Take 20 mg by mouth See admin instructions. Up to three (3) times daily for fluid retention.  Marland Kitchen ibuprofen (ADVIL,MOTRIN) 200 MG tablet  Take 400 mg by mouth every 6 (six) hours as needed (for pain.).  Marland Kitchen ipratropium-albuterol (DUONEB) 0.5-2.5 (3) MG/3ML SOLN Take 3 mLs by nebulization as needed (shortness of breath).   . latanoprost (XALATAN) 0.005 % ophthalmic solution Place 1 drop into both eyes at bedtime.   . montelukast (SINGULAIR) 10 MG tablet Take 1 tablet (10 mg total) by mouth at bedtime. (Patient taking differently: Take  10 mg by mouth daily. )  . OXYGEN Inhale 3 L into the lungs continuous. 24 hrs day   . pantoprazole (PROTONIX) 40 MG tablet Take 30- 60 min before your first and last meals of the day  . potassium chloride SA (K-DUR,KLOR-CON) 20 MEQ tablet Take 20 mEq by mouth daily.   . valsartan-hydrochlorothiazide (DIOVAN HCT) 160-25 MG tablet Take 1 tablet by mouth daily.  . [DISCONTINUED] HYDROcodone-acetaminophen (NORCO) 5-325 MG tablet Take 1 tablet by mouth every 6 (six) hours as needed for moderate pain.  Marland Kitchen HYDROcodone-ibuprofen (VICOPROFEN) 7.5-200 MG tablet Take 1 tablet by mouth every 4 (four) hours as needed for moderate pain.   No facility-administered encounter medications on file as of 07/18/2017.

## 2017-07-18 NOTE — Assessment & Plan Note (Signed)
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  Discussed in detail all the  indications, usual  risks and alternatives  relative to the benefits with patient who agrees to proceed with conservative w/u as outlined as clearly not a surgical candidate as already sob room to room

## 2017-07-18 NOTE — Assessment & Plan Note (Signed)
Adequate control on present rx, reviewed in detail with pt > no change in rx needed  = 3lpm 24/7

## 2017-07-18 NOTE — Assessment & Plan Note (Signed)
Trial off acei 06/20/2017 due to barking cough and resting sob > improved 07/18/2017   Adequate control on present rx, reviewed in detail with pt > no change in rx needed   Although even in retrospect it may not be clear the ACEi contributed to the pt's symptoms,  Pt improved off them and adding them back at this point or in the future would risk confusion in interpretation of non-specific respiratory symptoms to which this patient is prone  ie  Better not to muddy the waters here.

## 2017-07-18 NOTE — Assessment & Plan Note (Signed)
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

## 2017-07-18 NOTE — Patient Instructions (Addendum)
I will send your oncologist a copy of this report offering several options for you but I don't believe you would tolerate surgery at this point  I am glad your cough is better  Increase your norco to 7.5 mg every 4 hours as needed but keep in mind this may cause constipation so may need stool softeners or milk of magnesia to treat this   If Dr Hinton Rao decides on bronchoscopy:  Come to outpatient registration at [facility] (behind the ER) at 715 am  with nothing to eat or drink after midnight the night before but you can take your medications with a small amount of water

## 2017-07-19 DIAGNOSIS — R109 Unspecified abdominal pain: Secondary | ICD-10-CM | POA: Diagnosis not present

## 2017-07-19 DIAGNOSIS — R079 Chest pain, unspecified: Secondary | ICD-10-CM | POA: Diagnosis not present

## 2017-07-19 DIAGNOSIS — R1111 Vomiting without nausea: Secondary | ICD-10-CM | POA: Diagnosis not present

## 2017-07-19 DIAGNOSIS — K2970 Gastritis, unspecified, without bleeding: Secondary | ICD-10-CM | POA: Diagnosis not present

## 2017-07-19 DIAGNOSIS — K7460 Unspecified cirrhosis of liver: Secondary | ICD-10-CM | POA: Diagnosis not present

## 2017-07-19 DIAGNOSIS — R1084 Generalized abdominal pain: Secondary | ICD-10-CM | POA: Diagnosis not present

## 2017-07-19 DIAGNOSIS — R112 Nausea with vomiting, unspecified: Secondary | ICD-10-CM | POA: Diagnosis not present

## 2017-07-21 DIAGNOSIS — D500 Iron deficiency anemia secondary to blood loss (chronic): Secondary | ICD-10-CM | POA: Diagnosis not present

## 2017-07-21 DIAGNOSIS — Q2733 Arteriovenous malformation of digestive system vessel: Secondary | ICD-10-CM | POA: Diagnosis not present

## 2017-07-21 DIAGNOSIS — D638 Anemia in other chronic diseases classified elsewhere: Secondary | ICD-10-CM | POA: Diagnosis not present

## 2017-07-21 DIAGNOSIS — J189 Pneumonia, unspecified organism: Secondary | ICD-10-CM | POA: Diagnosis not present

## 2017-07-21 DIAGNOSIS — D509 Iron deficiency anemia, unspecified: Secondary | ICD-10-CM | POA: Diagnosis not present

## 2017-07-21 DIAGNOSIS — K7460 Unspecified cirrhosis of liver: Secondary | ICD-10-CM | POA: Diagnosis not present

## 2017-07-21 DIAGNOSIS — Z9981 Dependence on supplemental oxygen: Secondary | ICD-10-CM | POA: Diagnosis not present

## 2017-07-21 DIAGNOSIS — K766 Portal hypertension: Secondary | ICD-10-CM | POA: Diagnosis not present

## 2017-07-21 DIAGNOSIS — J449 Chronic obstructive pulmonary disease, unspecified: Secondary | ICD-10-CM | POA: Diagnosis not present

## 2017-07-21 DIAGNOSIS — I639 Cerebral infarction, unspecified: Secondary | ICD-10-CM | POA: Diagnosis not present

## 2017-07-21 DIAGNOSIS — F0390 Unspecified dementia without behavioral disturbance: Secondary | ICD-10-CM | POA: Diagnosis not present

## 2017-07-21 DIAGNOSIS — R918 Other nonspecific abnormal finding of lung field: Secondary | ICD-10-CM | POA: Diagnosis not present

## 2017-07-22 DIAGNOSIS — C3431 Malignant neoplasm of lower lobe, right bronchus or lung: Secondary | ICD-10-CM | POA: Diagnosis not present

## 2017-07-22 DIAGNOSIS — R410 Disorientation, unspecified: Secondary | ICD-10-CM | POA: Diagnosis not present

## 2017-07-22 DIAGNOSIS — R531 Weakness: Secondary | ICD-10-CM | POA: Diagnosis not present

## 2017-07-22 DIAGNOSIS — R262 Difficulty in walking, not elsewhere classified: Secondary | ICD-10-CM | POA: Diagnosis not present

## 2017-07-22 DIAGNOSIS — R413 Other amnesia: Secondary | ICD-10-CM | POA: Diagnosis not present

## 2017-07-26 DIAGNOSIS — R110 Nausea: Secondary | ICD-10-CM | POA: Diagnosis not present

## 2017-07-26 DIAGNOSIS — R1084 Generalized abdominal pain: Secondary | ICD-10-CM | POA: Diagnosis not present

## 2017-07-26 DIAGNOSIS — D509 Iron deficiency anemia, unspecified: Secondary | ICD-10-CM | POA: Diagnosis not present

## 2017-07-26 DIAGNOSIS — C3431 Malignant neoplasm of lower lobe, right bronchus or lung: Secondary | ICD-10-CM | POA: Diagnosis not present

## 2017-08-01 DIAGNOSIS — J449 Chronic obstructive pulmonary disease, unspecified: Secondary | ICD-10-CM | POA: Diagnosis not present

## 2017-08-01 DIAGNOSIS — J441 Chronic obstructive pulmonary disease with (acute) exacerbation: Secondary | ICD-10-CM | POA: Diagnosis not present

## 2017-08-01 DIAGNOSIS — R0902 Hypoxemia: Secondary | ICD-10-CM | POA: Diagnosis not present

## 2017-08-02 ENCOUNTER — Other Ambulatory Visit: Payer: Self-pay | Admitting: Orthopedic Surgery

## 2017-08-03 DIAGNOSIS — D509 Iron deficiency anemia, unspecified: Secondary | ICD-10-CM | POA: Diagnosis not present

## 2017-08-04 DIAGNOSIS — D509 Iron deficiency anemia, unspecified: Secondary | ICD-10-CM | POA: Diagnosis not present

## 2017-08-05 DIAGNOSIS — I96 Gangrene, not elsewhere classified: Secondary | ICD-10-CM | POA: Diagnosis not present

## 2017-08-05 DIAGNOSIS — M6281 Muscle weakness (generalized): Secondary | ICD-10-CM | POA: Diagnosis not present

## 2017-08-05 DIAGNOSIS — E119 Type 2 diabetes mellitus without complications: Secondary | ICD-10-CM | POA: Diagnosis not present

## 2017-08-05 DIAGNOSIS — K219 Gastro-esophageal reflux disease without esophagitis: Secondary | ICD-10-CM | POA: Diagnosis not present

## 2017-08-05 DIAGNOSIS — M170 Bilateral primary osteoarthritis of knee: Secondary | ICD-10-CM | POA: Diagnosis not present

## 2017-08-05 DIAGNOSIS — I739 Peripheral vascular disease, unspecified: Secondary | ICD-10-CM | POA: Diagnosis not present

## 2017-08-05 DIAGNOSIS — G309 Alzheimer's disease, unspecified: Secondary | ICD-10-CM | POA: Diagnosis not present

## 2017-08-05 DIAGNOSIS — R234 Changes in skin texture: Secondary | ICD-10-CM | POA: Diagnosis not present

## 2017-08-05 DIAGNOSIS — J410 Simple chronic bronchitis: Secondary | ICD-10-CM | POA: Diagnosis not present

## 2017-08-05 DIAGNOSIS — R262 Difficulty in walking, not elsewhere classified: Secondary | ICD-10-CM | POA: Diagnosis not present

## 2017-08-05 DIAGNOSIS — M810 Age-related osteoporosis without current pathological fracture: Secondary | ICD-10-CM | POA: Diagnosis not present

## 2017-08-05 DIAGNOSIS — D500 Iron deficiency anemia secondary to blood loss (chronic): Secondary | ICD-10-CM | POA: Diagnosis not present

## 2017-08-05 DIAGNOSIS — M545 Low back pain: Secondary | ICD-10-CM | POA: Diagnosis not present

## 2017-08-08 DIAGNOSIS — D500 Iron deficiency anemia secondary to blood loss (chronic): Secondary | ICD-10-CM | POA: Diagnosis not present

## 2017-08-08 DIAGNOSIS — J410 Simple chronic bronchitis: Secondary | ICD-10-CM | POA: Diagnosis not present

## 2017-08-08 DIAGNOSIS — E119 Type 2 diabetes mellitus without complications: Secondary | ICD-10-CM | POA: Diagnosis not present

## 2017-08-08 DIAGNOSIS — R234 Changes in skin texture: Secondary | ICD-10-CM | POA: Diagnosis not present

## 2017-08-10 DIAGNOSIS — I10 Essential (primary) hypertension: Secondary | ICD-10-CM | POA: Diagnosis not present

## 2017-08-12 DIAGNOSIS — I771 Stricture of artery: Secondary | ICD-10-CM | POA: Diagnosis not present

## 2017-08-12 DIAGNOSIS — D649 Anemia, unspecified: Secondary | ICD-10-CM | POA: Diagnosis not present

## 2017-08-12 DIAGNOSIS — D509 Iron deficiency anemia, unspecified: Secondary | ICD-10-CM | POA: Diagnosis not present

## 2017-08-12 DIAGNOSIS — C3431 Malignant neoplasm of lower lobe, right bronchus or lung: Secondary | ICD-10-CM | POA: Diagnosis not present

## 2017-08-12 DIAGNOSIS — Z9981 Dependence on supplemental oxygen: Secondary | ICD-10-CM | POA: Diagnosis not present

## 2017-08-12 DIAGNOSIS — J449 Chronic obstructive pulmonary disease, unspecified: Secondary | ICD-10-CM | POA: Diagnosis not present

## 2017-08-12 DIAGNOSIS — C3490 Malignant neoplasm of unspecified part of unspecified bronchus or lung: Secondary | ICD-10-CM | POA: Diagnosis not present

## 2017-08-19 ENCOUNTER — Ambulatory Visit: Admit: 2017-08-19 | Payer: Medicare Other | Admitting: Orthopedic Surgery

## 2017-08-19 SURGERY — AMPUTATION DIGIT
Anesthesia: Choice | Site: Finger | Laterality: Right

## 2017-08-20 DEATH — deceased

## 2018-01-16 IMAGING — XA IR US GUIDE VASC ACCESS RIGHT
1 series · 12 of 24 positions shown · IV contrast (IODINE)
Comparison: none

INDICATION: 75-year-old female with ulcer at the distal aspect of the right
index finger.

[Series 300: ir angiogram extremity right · 12 of 65 slices shown]
[im 3/65]
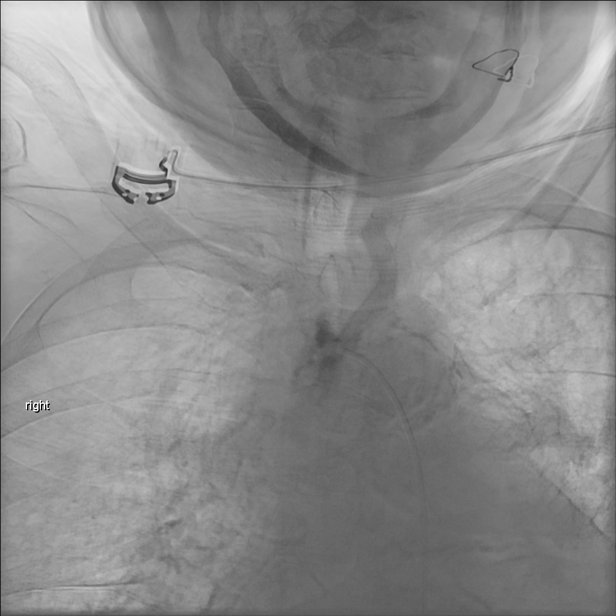
[im 9/65]
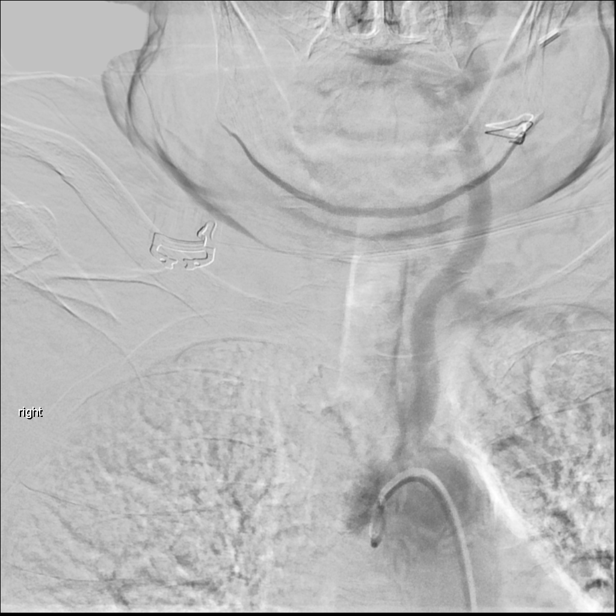
[im 14/65]
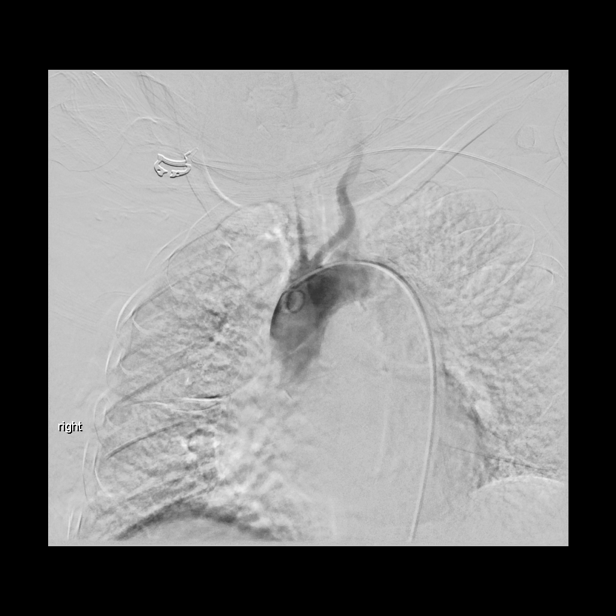
[im 20/65]
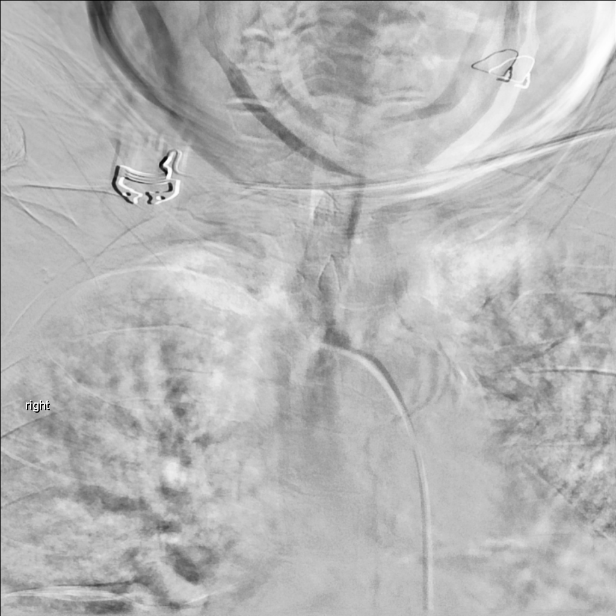
[im 26/65]
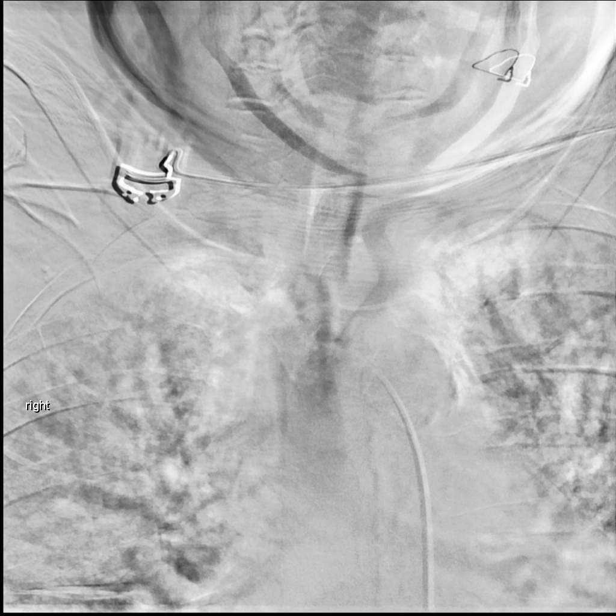
[im 31/65]
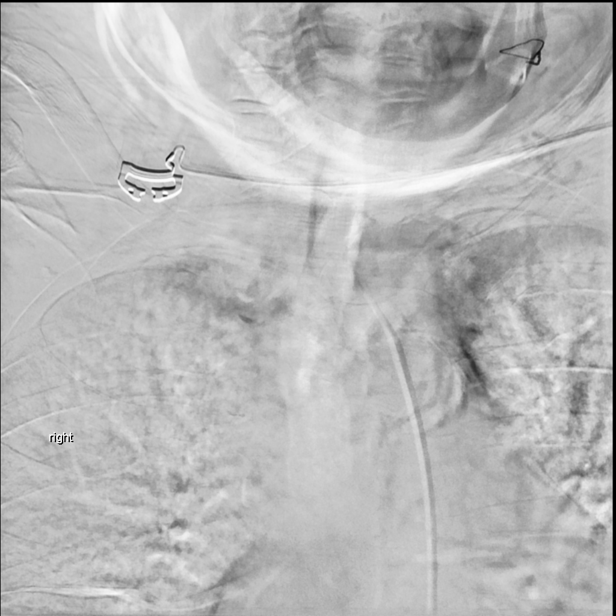
[im 37/65]
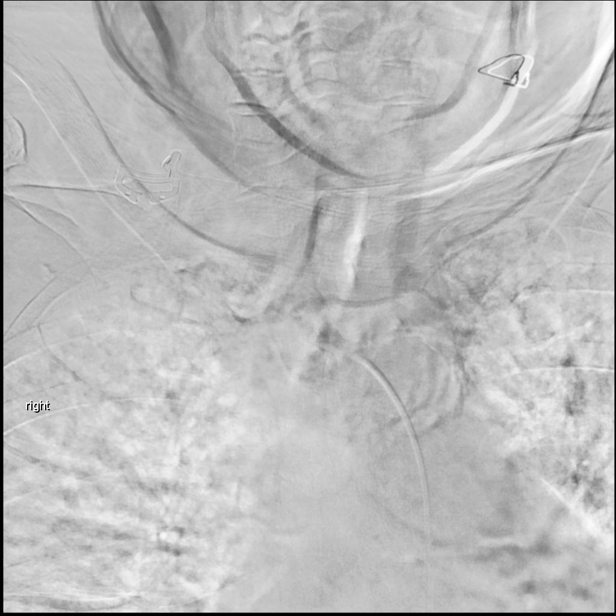
[im 42/65]
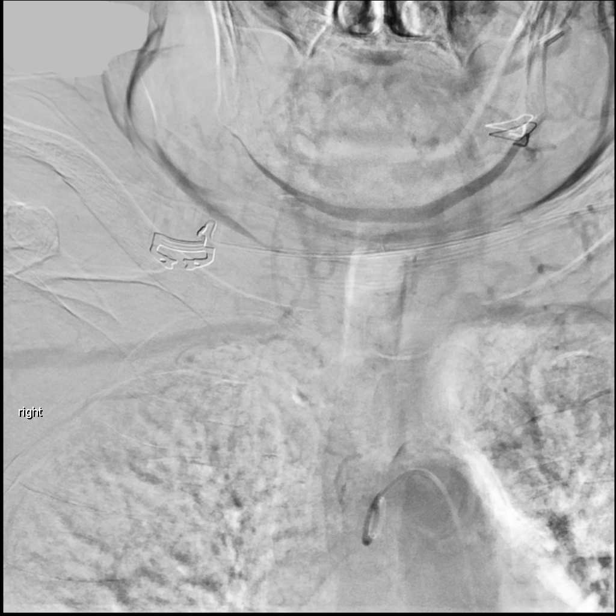
[im 48/65]
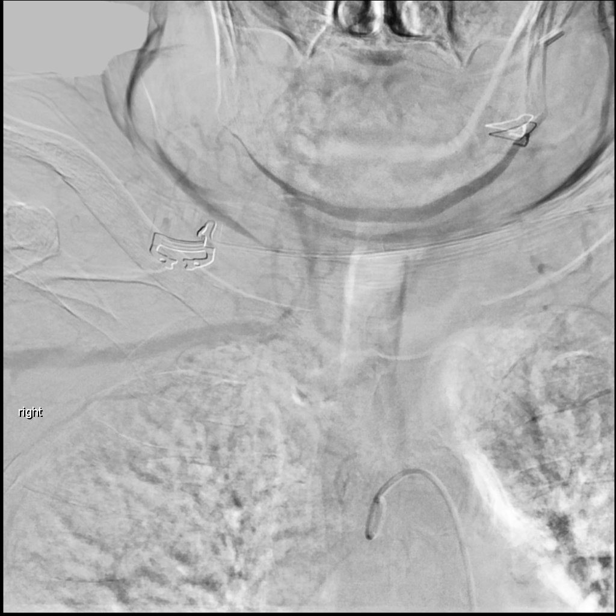
[im 53/65]
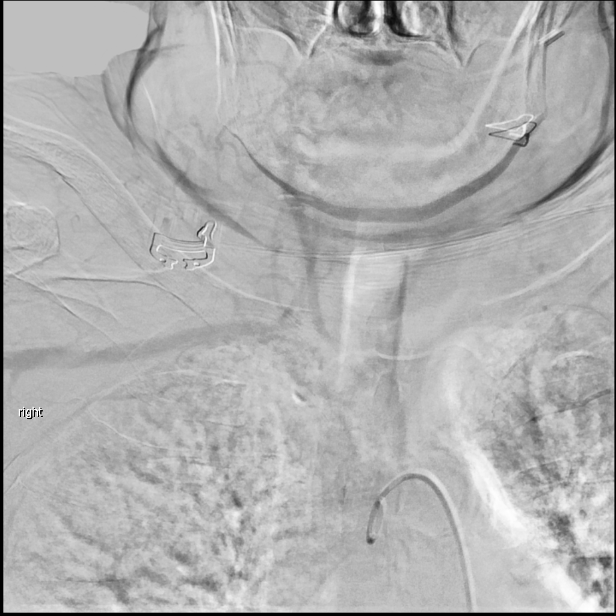
[im 59/65]
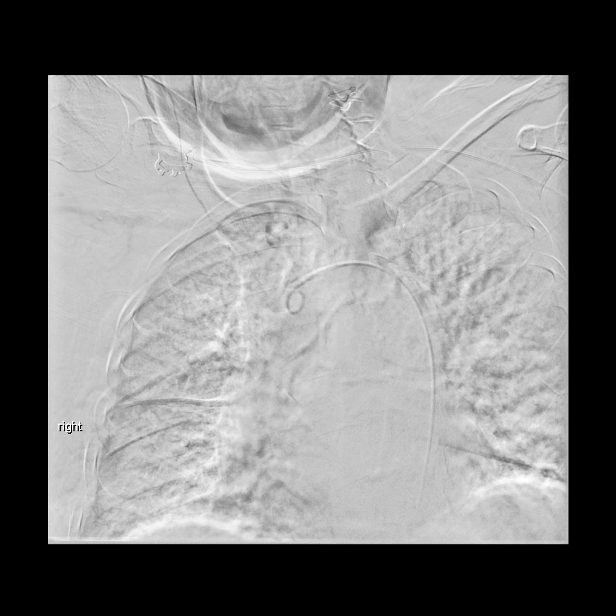
[im 65/65]
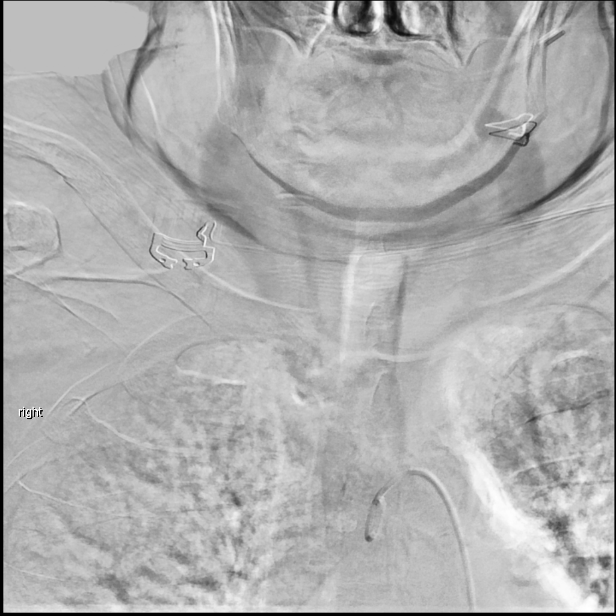

[12 of 24 positions shown; findings below may reference images not displayed]

She has been referred for right upper extremity angiogram.

EXAM:
IR ULTRASOUND GUIDANCE VASC ACCESS RIGHT; IR ARCH CERVICOCEBRAL
NON-SEL (MS)

MEDICATIONS:
None

ANESTHESIA/SEDATION:
Moderate (conscious) sedation was employed during this procedure. A
total of Versed 5.0 mg and Fentanyl 100 mcg was administered
intravenously.

Moderate Sedation Time: 34 minutes. The patient's level of
consciousness and vital signs were monitored continuously by
radiology nursing throughout the procedure under my direct
supervision.

CONTRAST:  75mL VISIPAQUE IODIXANOL 320 MG/ML IV SOLN

FLUOROSCOPY TIME:  Fluoroscopy Time: 5 minutes 6 seconds (268 mGy).

COMPLICATIONS:
None



Ultrasound survey of the right inguinal region was performed with
images stored and sent to PACs.

A micropuncture needle was used access the right common femoral
artery under ultrasound. With excellent arterial blood flow
returned, and an .018 micro wire was passed through the needle,
observed enter the abdominal aorta under fluoroscopy. The needle was
removed, and a micropuncture sheath was placed over the wire. The
inner dilator and wire were removed, and an 035 Bentson wire was
advanced under fluoroscopy into the abdominal aorta. The sheath was
removed and a standard 5 French vascular sheath was placed. The
dilator was removed and the sheath was flushed.

JB 1 catheter was used to navigate the Bentson wire through distal
aortic disease, with the catheter placed beyond the left subclavian
artery origin.

Catheter was then exchanged over the wire for a pigtail Flush
catheter.

Pigtail Flush catheter was formed distal to the left subclavian
artery, with double flush performed.

Catheter was then advanced over the aortic arch, positioned near the
origin of the innominate artery. Left anterior oblique position was
established and formal angiogram was performed.

AP image of the aortic arch was then performed with a second power
injection with single magnification.

The catheter was then exchanged for a JB 1 catheter, with selection
at the stump of the common trunk. Dedicated angiogram was performed.

Angiogram was then performed at the origin of the left subclavian
artery and the origin of the left common carotid artery.

Limited angiogram of the right common femoral artery access site.

Exoseal was deployed for hemostasis.

Patient tolerated the procedure well and remained hemodynamically
stable throughout.

No complications were encountered and no significant blood loss.
FINDINGS: Ultrasound of the right common femoral artery demonstrates patent
vessel with atherosclerotic plaque.

Navigating the wire catheter combination through the distal aorta
demonstrated significant plaque, which was nonocclusive. Angiogram
not performed.

Angiogram of the aortic arch demonstrates common trunk of the
innominate artery and left common carotid artery. The innominate
artery is occluded just beyond the origin of the left common carotid
artery. Free flow of the left common carotid artery with no
significant stenosis.

Left subclavian artery patent without evidence of stenosis. Left
vertebral artery patent at the origin.

The right subclavian artery demonstrates reconstitution via
retrograde flow of the right-sided cervicocerebral vessels, with
retrograde flow in both the common carotid artery and the right
vertebral artery.

Selection of the stump of the common trunk confirms occlusion with
no antegrade flow through the innominate artery.

Angiogram of the right common femoral artery demonstrates puncture
of the common femoral artery with no significant atherosclerotic
changes.
IMPRESSION: Status post aortic arch angiogram demonstrating occlusion of the
innominate artery, which precludes a dedicated right upper extremity
angiogram. The subclavian artery demonstrates filling via retrograde
flow of the right-sided cervico-cerebral vessels.

Status post Exoseal for right common femoral artery hemostasis.

These results were called by telephone at the time of interpretation
on 02/21/2017 to Dr. BASTIAN IGNACIO MONJE , who verbally acknowledged these
results.
